# Patient Record
Sex: Male | Born: 1960 | Race: White | Hispanic: No | Marital: Single | Smoking: Current every day smoker
Health system: Southern US, Community
[De-identification: ages and names within clinical notes are randomized; demographics above are authoritative.]

## PROBLEM LIST (undated history)

## (undated) ENCOUNTER — Emergency Department (HOSPITAL_COMMUNITY): Discharge status: Absent without leave | Payer: Medicare Other

## (undated) DIAGNOSIS — B1920 Unspecified viral hepatitis C without hepatic coma: Secondary | ICD-10-CM

## (undated) DIAGNOSIS — F250 Schizoaffective disorder, bipolar type: Secondary | ICD-10-CM

## (undated) DIAGNOSIS — F4310 Post-traumatic stress disorder, unspecified: Secondary | ICD-10-CM

## (undated) DIAGNOSIS — F259 Schizoaffective disorder, unspecified: Secondary | ICD-10-CM

## (undated) DIAGNOSIS — F319 Bipolar disorder, unspecified: Secondary | ICD-10-CM

## (undated) HISTORY — PX: APPENDECTOMY: SHX54

## (undated) HISTORY — PX: GANGLION CYST EXCISION: SHX1691

## (undated) HISTORY — PX: SHOULDER SURGERY: SHX246

## (undated) HISTORY — PX: CERVICAL SPINE SURGERY: SHX589

---

## 2005-11-25 ENCOUNTER — Emergency Department (HOSPITAL_COMMUNITY): Admission: EM | Admit: 2005-11-25 | Discharge: 2005-11-25 | Payer: Self-pay | Admitting: *Deleted

## 2006-07-21 ENCOUNTER — Emergency Department (HOSPITAL_COMMUNITY): Admission: EM | Admit: 2006-07-21 | Discharge: 2006-07-21 | Payer: Self-pay | Admitting: Emergency Medicine

## 2006-09-24 ENCOUNTER — Emergency Department (HOSPITAL_COMMUNITY): Admission: EM | Admit: 2006-09-24 | Discharge: 2006-09-24 | Payer: Self-pay | Admitting: Emergency Medicine

## 2007-04-23 ENCOUNTER — Emergency Department (HOSPITAL_COMMUNITY): Admission: EM | Admit: 2007-04-23 | Discharge: 2007-04-24 | Payer: Self-pay | Admitting: Emergency Medicine

## 2007-04-24 ENCOUNTER — Ambulatory Visit: Payer: Self-pay | Admitting: Psychiatry

## 2007-04-24 ENCOUNTER — Inpatient Hospital Stay (HOSPITAL_COMMUNITY): Admission: AD | Admit: 2007-04-24 | Discharge: 2007-04-28 | Payer: Self-pay | Admitting: Psychiatry

## 2007-05-04 ENCOUNTER — Emergency Department (HOSPITAL_COMMUNITY): Admission: EM | Admit: 2007-05-04 | Discharge: 2007-05-04 | Payer: Self-pay | Admitting: Emergency Medicine

## 2008-04-24 ENCOUNTER — Ambulatory Visit: Payer: Self-pay | Admitting: Psychiatry

## 2008-04-24 ENCOUNTER — Emergency Department (HOSPITAL_COMMUNITY): Admission: EM | Admit: 2008-04-24 | Discharge: 2008-04-25 | Payer: Self-pay | Admitting: Emergency Medicine

## 2008-04-25 ENCOUNTER — Inpatient Hospital Stay (HOSPITAL_COMMUNITY): Admission: EM | Admit: 2008-04-25 | Discharge: 2008-05-01 | Payer: Self-pay | Admitting: Psychiatry

## 2008-05-31 ENCOUNTER — Emergency Department (HOSPITAL_COMMUNITY): Admission: EM | Admit: 2008-05-31 | Discharge: 2008-05-31 | Payer: Self-pay | Admitting: Emergency Medicine

## 2008-07-25 ENCOUNTER — Emergency Department (HOSPITAL_COMMUNITY): Admission: EM | Admit: 2008-07-25 | Discharge: 2008-07-26 | Payer: Self-pay | Admitting: Emergency Medicine

## 2008-10-05 ENCOUNTER — Other Ambulatory Visit: Payer: Self-pay | Admitting: Emergency Medicine

## 2008-10-06 ENCOUNTER — Inpatient Hospital Stay (HOSPITAL_COMMUNITY): Admission: AD | Admit: 2008-10-06 | Discharge: 2008-10-13 | Payer: Self-pay | Admitting: Psychiatry

## 2008-10-06 ENCOUNTER — Ambulatory Visit: Payer: Self-pay | Admitting: Psychiatry

## 2008-10-06 ENCOUNTER — Other Ambulatory Visit: Payer: Self-pay | Admitting: Psychiatry

## 2008-10-17 ENCOUNTER — Other Ambulatory Visit: Payer: Self-pay | Admitting: Emergency Medicine

## 2008-10-18 ENCOUNTER — Inpatient Hospital Stay (HOSPITAL_COMMUNITY): Admission: RE | Admit: 2008-10-18 | Discharge: 2008-10-22 | Payer: Self-pay | Admitting: Psychiatry

## 2008-12-20 ENCOUNTER — Other Ambulatory Visit: Payer: Self-pay | Admitting: Emergency Medicine

## 2008-12-20 ENCOUNTER — Inpatient Hospital Stay (HOSPITAL_COMMUNITY): Admission: EM | Admit: 2008-12-20 | Discharge: 2008-12-25 | Payer: Self-pay | Admitting: Psychiatry

## 2008-12-20 ENCOUNTER — Ambulatory Visit: Payer: Self-pay | Admitting: Psychiatry

## 2009-01-16 ENCOUNTER — Other Ambulatory Visit: Payer: Self-pay | Admitting: Emergency Medicine

## 2009-01-17 ENCOUNTER — Inpatient Hospital Stay (HOSPITAL_COMMUNITY): Admission: AD | Admit: 2009-01-17 | Discharge: 2009-01-23 | Payer: Self-pay | Admitting: *Deleted

## 2009-01-17 ENCOUNTER — Ambulatory Visit: Payer: Self-pay | Admitting: *Deleted

## 2009-03-24 ENCOUNTER — Inpatient Hospital Stay (HOSPITAL_COMMUNITY): Admission: AD | Admit: 2009-03-24 | Discharge: 2009-03-28 | Payer: Self-pay | Admitting: Psychiatry

## 2009-03-24 ENCOUNTER — Ambulatory Visit: Payer: Self-pay | Admitting: Psychiatry

## 2009-03-24 ENCOUNTER — Other Ambulatory Visit: Payer: Self-pay | Admitting: Emergency Medicine

## 2010-03-16 ENCOUNTER — Other Ambulatory Visit: Payer: Self-pay | Admitting: Emergency Medicine

## 2010-03-17 ENCOUNTER — Other Ambulatory Visit: Payer: Self-pay | Admitting: Emergency Medicine

## 2010-03-17 ENCOUNTER — Inpatient Hospital Stay (HOSPITAL_COMMUNITY): Admission: AD | Admit: 2010-03-17 | Discharge: 2010-03-22 | Payer: Self-pay | Admitting: Psychiatry

## 2010-03-17 ENCOUNTER — Ambulatory Visit: Payer: Self-pay | Admitting: Psychiatry

## 2010-05-10 ENCOUNTER — Emergency Department (HOSPITAL_COMMUNITY)
Admission: EM | Admit: 2010-05-10 | Discharge: 2010-05-12 | Payer: Self-pay | Source: Home / Self Care | Admitting: Emergency Medicine

## 2010-05-17 LAB — DIFFERENTIAL
Basophils Absolute: 0 10*3/uL (ref 0.0–0.1)
Basophils Relative: 0 % (ref 0–1)
Eosinophils Absolute: 0 10*3/uL (ref 0.0–0.7)
Eosinophils Relative: 0 % (ref 0–5)
Lymphocytes Relative: 33 % (ref 12–46)
Lymphs Abs: 2.3 10*3/uL (ref 0.7–4.0)
Monocytes Absolute: 0.6 10*3/uL (ref 0.1–1.0)
Monocytes Relative: 8 % (ref 3–12)
Neutro Abs: 4.1 10*3/uL (ref 1.7–7.7)
Neutrophils Relative %: 58 % (ref 43–77)

## 2010-05-17 LAB — CBC
HCT: 41.6 % (ref 39.0–52.0)
Hemoglobin: 14.8 g/dL (ref 13.0–17.0)
MCH: 32.8 pg (ref 26.0–34.0)
MCHC: 35.6 g/dL (ref 30.0–36.0)
MCV: 92.2 fL (ref 78.0–100.0)
Platelets: 268 10*3/uL (ref 150–400)
RBC: 4.51 MIL/uL (ref 4.22–5.81)
RDW: 12.5 % (ref 11.5–15.5)
WBC: 7.1 10*3/uL (ref 4.0–10.5)

## 2010-05-17 LAB — URINALYSIS, ROUTINE W REFLEX MICROSCOPIC
Bilirubin Urine: NEGATIVE
Hgb urine dipstick: NEGATIVE
Ketones, ur: NEGATIVE mg/dL
Nitrite: NEGATIVE
Protein, ur: NEGATIVE mg/dL
Specific Gravity, Urine: 1.017 (ref 1.005–1.030)
Urine Glucose, Fasting: NEGATIVE mg/dL
Urobilinogen, UA: 0.2 mg/dL (ref 0.0–1.0)
pH: 5.5 (ref 5.0–8.0)

## 2010-05-17 LAB — URINE MICROSCOPIC-ADD ON

## 2010-05-17 LAB — BASIC METABOLIC PANEL
BUN: 12 mg/dL (ref 6–23)
CO2: 25 mEq/L (ref 19–32)
Calcium: 9.6 mg/dL (ref 8.4–10.5)
Chloride: 106 mEq/L (ref 96–112)
Creatinine, Ser: 1.06 mg/dL (ref 0.4–1.5)
GFR calc Af Amer: >60 mL/min (ref >60)
GFR calc non Af Amer: >60 mL/min (ref >60)
Glucose, Bld: 95 mg/dL (ref 70–99)
Potassium: 3.8 mEq/L (ref 3.5–5.1)
Sodium: 140 mEq/L (ref 135–145)

## 2010-05-17 LAB — HEPATIC FUNCTION PANEL
ALT: 21 U/L (ref 0–53)
AST: 26 U/L (ref 0–37)
Albumin: 4.4 g/dL (ref 3.5–5.2)
Alkaline Phosphatase: 59 U/L (ref 39–117)
Bilirubin, Direct: 0.1 mg/dL (ref 0.0–0.3)
Indirect Bilirubin: 0.5 mg/dL (ref 0.3–0.9)
Total Bilirubin: 0.6 mg/dL (ref 0.3–1.2)
Total Protein: 7.4 g/dL (ref 6.0–8.3)

## 2010-05-17 LAB — RAPID URINE DRUG SCREEN, HOSP PERFORMED
Amphetamines: NOT DETECTED
Barbiturates: NOT DETECTED
Benzodiazepines: NOT DETECTED
Cocaine: POSITIVE — AB
Opiates: NOT DETECTED
Tetrahydrocannabinol: NOT DETECTED

## 2010-05-17 LAB — ETHANOL: Alcohol, Ethyl (B): 103 mg/dL — ABNORMAL HIGH (ref 0–10)

## 2010-06-03 ENCOUNTER — Emergency Department (HOSPITAL_COMMUNITY)
Admission: EM | Admit: 2010-06-03 | Discharge: 2010-06-04 | Disposition: A | Discharge status: Other Acute Inpatient Hospital | Payer: Medicare Other | Source: Home / Self Care | Attending: Emergency Medicine | Admitting: Emergency Medicine

## 2010-06-03 DIAGNOSIS — F1010 Alcohol abuse, uncomplicated: Secondary | ICD-10-CM | POA: Insufficient documentation

## 2010-06-03 DIAGNOSIS — F329 Major depressive disorder, single episode, unspecified: Secondary | ICD-10-CM | POA: Insufficient documentation

## 2010-06-03 DIAGNOSIS — R45851 Suicidal ideations: Secondary | ICD-10-CM | POA: Insufficient documentation

## 2010-06-03 DIAGNOSIS — F1410 Cocaine abuse, uncomplicated: Secondary | ICD-10-CM | POA: Insufficient documentation

## 2010-06-03 DIAGNOSIS — F3289 Other specified depressive episodes: Secondary | ICD-10-CM | POA: Insufficient documentation

## 2010-06-03 LAB — COMPREHENSIVE METABOLIC PANEL
ALT: 27 U/L (ref 0–53)
AST: 31 U/L (ref 0–37)
Albumin: 4.3 g/dL (ref 3.5–5.2)
Alkaline Phosphatase: 54 U/L (ref 39–117)
BUN: 8 mg/dL (ref 6–23)
CO2: 29 mEq/L (ref 19–32)
Calcium: 10 mg/dL (ref 8.4–10.5)
Chloride: 105 mEq/L (ref 96–112)
Creatinine, Ser: 1.18 mg/dL (ref 0.4–1.5)
GFR calc Af Amer: >60 mL/min (ref >60)
GFR calc non Af Amer: >60 mL/min (ref >60)
Glucose, Bld: 85 mg/dL (ref 70–99)
Potassium: 4.4 mEq/L (ref 3.5–5.1)
Sodium: 143 mEq/L (ref 135–145)
Total Bilirubin: 0.5 mg/dL (ref 0.3–1.2)
Total Protein: 7.2 g/dL (ref 6.0–8.3)

## 2010-06-03 LAB — CBC
HCT: 40.6 % (ref 39.0–52.0)
Hemoglobin: 14.3 g/dL (ref 13.0–17.0)
MCH: 32.4 pg (ref 26.0–34.0)
MCHC: 35.2 g/dL (ref 30.0–36.0)
MCV: 91.9 fL (ref 78.0–100.0)
Platelets: 184 10*3/uL (ref 150–400)
RBC: 4.42 MIL/uL (ref 4.22–5.81)
RDW: 12.7 % (ref 11.5–15.5)
WBC: 9.3 10*3/uL (ref 4.0–10.5)

## 2010-06-03 LAB — ETHANOL: Alcohol, Ethyl (B): 31 mg/dL — ABNORMAL HIGH (ref 0–10)

## 2010-06-03 LAB — RAPID URINE DRUG SCREEN, HOSP PERFORMED
Amphetamines: NOT DETECTED
Barbiturates: NOT DETECTED
Benzodiazepines: NOT DETECTED
Cocaine: POSITIVE — AB
Opiates: NOT DETECTED
Tetrahydrocannabinol: NOT DETECTED

## 2010-06-03 LAB — DIFFERENTIAL
Basophils Absolute: 0 10*3/uL (ref 0.0–0.1)
Basophils Relative: 0 % (ref 0–1)
Eosinophils Absolute: 0 10*3/uL (ref 0.0–0.7)
Eosinophils Relative: 0 % (ref 0–5)
Lymphocytes Relative: 19 % (ref 12–46)
Lymphs Abs: 1.8 10*3/uL (ref 0.7–4.0)
Monocytes Absolute: 0.8 10*3/uL (ref 0.1–1.0)
Monocytes Relative: 8 % (ref 3–12)
Neutro Abs: 6.7 10*3/uL (ref 1.7–7.7)
Neutrophils Relative %: 72 % (ref 43–77)

## 2010-06-04 ENCOUNTER — Inpatient Hospital Stay (HOSPITAL_COMMUNITY)
Admission: RE | Admit: 2010-06-04 | Discharge: 2010-06-11 | DRG: 885 | Disposition: A | Discharge status: Home / Self Care | Payer: Medicare Other | Attending: Psychiatry | Admitting: Psychiatry

## 2010-06-04 DIAGNOSIS — R45851 Suicidal ideations: Secondary | ICD-10-CM

## 2010-06-04 DIAGNOSIS — F1920 Other psychoactive substance dependence, uncomplicated: Secondary | ICD-10-CM

## 2010-06-04 DIAGNOSIS — F319 Bipolar disorder, unspecified: Principal | ICD-10-CM | POA: Diagnosis present

## 2010-06-04 DIAGNOSIS — F1410 Cocaine abuse, uncomplicated: Secondary | ICD-10-CM | POA: Diagnosis present

## 2010-06-04 DIAGNOSIS — F39 Unspecified mood [affective] disorder: Secondary | ICD-10-CM | POA: Diagnosis present

## 2010-06-04 DIAGNOSIS — F1010 Alcohol abuse, uncomplicated: Secondary | ICD-10-CM | POA: Diagnosis present

## 2010-06-04 DIAGNOSIS — M25559 Pain in unspecified hip: Secondary | ICD-10-CM | POA: Diagnosis present

## 2010-06-05 DIAGNOSIS — F1920 Other psychoactive substance dependence, uncomplicated: Secondary | ICD-10-CM

## 2010-06-05 LAB — VALPROIC ACID LEVEL: Valproic Acid Lvl: 7 ug/mL — ABNORMAL LOW (ref 50.0–100.0)

## 2010-06-10 LAB — CBC
HCT: 48.8 % (ref 39.0–52.0)
Hemoglobin: 17 g/dL (ref 13.0–17.0)
MCH: 32.1 pg (ref 26.0–34.0)
MCHC: 34.8 g/dL (ref 30.0–36.0)
MCV: 92.1 fL (ref 78.0–100.0)
Platelets: 158 10*3/uL (ref 150–400)
RBC: 5.3 MIL/uL (ref 4.22–5.81)
RDW: 12.4 % (ref 11.5–15.5)
WBC: 6 10*3/uL (ref 4.0–10.5)

## 2010-06-10 LAB — COMPREHENSIVE METABOLIC PANEL
ALT: 27 U/L (ref 0–53)
AST: 27 U/L (ref 0–37)
Albumin: 4.1 g/dL (ref 3.5–5.2)
Alkaline Phosphatase: 58 U/L (ref 39–117)
BUN: 19 mg/dL (ref 6–23)
CO2: 19 mEq/L (ref 19–32)
Calcium: 9.3 mg/dL (ref 8.4–10.5)
Chloride: 107 mEq/L (ref 96–112)
Creatinine, Ser: 1.28 mg/dL (ref 0.4–1.5)
GFR calc Af Amer: >60 mL/min (ref >60)
GFR calc non Af Amer: 59 mL/min — ABNORMAL LOW (ref >60)
Glucose, Bld: 116 mg/dL — ABNORMAL HIGH (ref 70–99)
Potassium: 5.4 mEq/L — ABNORMAL HIGH (ref 3.5–5.1)
Sodium: 136 mEq/L (ref 135–145)
Total Bilirubin: 0.7 mg/dL (ref 0.3–1.2)
Total Protein: 7.4 g/dL (ref 6.0–8.3)

## 2010-06-10 LAB — VALPROIC ACID LEVEL: Valproic Acid Lvl: 58.4 ug/mL (ref 50.0–100.0)

## 2010-06-15 NOTE — Discharge Summary (Signed)
[name]              ACCOUNT NO.:  [id number]  MEDICAL RECORD NO.:  [id number]           PATIENT TYPE:  I  LOCATION:  0507                          FACILITY:  BH  PHYSICIAN:  Marlis Edelson, DO        DATE OF BIRTH:  1961-05-01  DATE OF ADMISSION:  06/04/2010 DATE OF DISCHARGE:  06/11/2010                              DISCHARGE SUMMARY   CHIEF COMPLAINT:  Trying to stay away from drugs.  HISTORY OF CHIEF COMPLAINT:  [name] is a 50 year old Caucasian male admitted via voluntary status to the [facility] on June 04, 2010.  He had presented because he was trying to stay away from drugs.  He had recently relapsed on cocaine using crack cocaine the day before admission.  That made him feel depressed.  He felt that there was no way out.  He did have suicidal thoughts because of his relapse and states that if he had a gun he would have pulled the trigger. Reports that he has not been taking his medications for the past several days and denied any other specific stressors.  He reported the use of alcohol but was denying any psychotic symptoms or any homicidal ideation.  PAST PSYCHIATRIC HISTORY:  Pertinent for previous stay at the [facility] in November of 2011.  He has had similar admissions because of a history of bipolar disorder, alcohol use and cocaine use. He has not recently seen his psychiatrist.  He stated that he comes to Behavioral Health to get his medications.  MEDICATIONS: 1. Reported medications which he was currently not taking included     Depakote 1500 mg q.h.s. 2. Seroquel 700 mg at bedtime. 3. Trazodone 100 mg at bedtime.  ALLERGIES:  No known drug allergies.  He relates when he is on his medications his mood is more stable and he is less likely to abuse drugs.  HOSPITAL COURSE:  Mr. Macmillan was admitted to the adult substance abuse treatment unit.  He was placed in the adult milieu with substance abuse groups  and AA.  Early in his admission he had difficulty sleeping.  This improved with dose adjustments of his Seroquel during the course of his hospitalization.  He had no significant withdrawal symptoms including no DTs, no seizures and no other physical complications with the exception of diarrhea later in his admission.  Medications included the reinitiation of Seroquel 700 mg at bedtime, trazodone 200 mg at bedtime and Depakote 1500 mg at bedtime.  By June 07, 2010, he stated he felt a little tired.  He was having some problems with early morning awakening.  No suicidal or homicidal thoughts.  His suicidal thoughts had completely resolved.  He was having no aches and pains, no nausea, vomiting, no diarrhea at that time and no cravings.  His perceptual symptoms had completely resolved with the restart of Seroquel.  He continued to feel better by February 7 stating he slept better.  His appetite was good.  He again had no SI or HI.  He looked better.  His head was up.  He was smiling and engaging.  We continued his current medications.  On June 09, 2010, he started to develop diarrhea following an episode in which he ate sausage.  He had no fever, no chills, no vomiting, no abdominal tenderness, no rebound symptoms. There was no blood in his stools.  He was treated symptomatically by encouraging fluids, increasing his p.o. intake with Gatorade and adding Imodium.  His mood, however, was good.  He was looking forward to the [facility] game on the evening of June 09, 2010.  He had no other complaints.  On February 9 his diarrhea had increased.  His vital signs remained stable.  We held the Depakote for 1 night and checked a valproic acid level.  Laboratory returned showing a normal sodium, potassium, chloride, CO2, BUN and creatinine and glucose of 116.  AST was 27, ALT was 27, total protein 7.4, albumin 4.1, calcium 9.3, alk phos 58.  CBC was 6.0, hemoglobin 17.0, hematocrit 48.8 and  platelet count was 158.  Indices were normal.  His valproic acid level returned and was within normal limits.  He again tolerated fluids well.  He had no further suicidal or homicidal thoughts.  He felt medications were working.  Insight into his drug use had increased.  He stated his mood was level, his abdominal pain completely resolved and he was ready for discharge on June 11, 2010.  LABORATORY AND IMAGING:  As above.  CONSULTATIONS:  None.  COMPLICATIONS:  Diarrhea as noted above.  PROCEDURES:  None.  MENTAL STATUS EXAM:  At time of discharge he was casually dressed, established good eye contact.  Motor behavior was normal.  Speech was clear.  Level of consciousness was alert.  His mood was "fine", affect was appropriate, anxiety level none.  Thought process linear, logical and goal-directed.  Thought content without perceptual symptoms, ideas of reference, delusions or paranoia.  His judgment was intact.  His insight was increased.  He was oriented and displayed good concentration.  DISCHARGE CONDITION:  Was improved with an uneventful detox.  Resolution of suicidal ideation.  No homicidal ideation.  No hypomania, mania or psychosis.  No intolerance to medications or medication reactions.  DISCHARGE INSTRUCTIONS:  He is to follow up with the [facility] on Wednesday June 16, 2010, at 4:00 p.m.  I recommended involvement in Alcoholics Anonymous with 90 meetings in 90 days, recommended followup with his primary care physician on the Monday following discharge with the [facility] [place]  FURTHER INSTRUCTIONS:  He is return to the hospital for any marked change in mood or affect, development of suicidal or homicidal ideation or adverse reactions to medications.  DISCHARGE MEDICATIONS: 1. Quetiapine 700 mg q.h.s. 2. Trazodone 200 mg q.h.s. 3. Depakote 1500 mg ER p.o. q.h.s.  PROGNOSIS:  Fair with appropriate psychiatric  followup, medication compliance, involvement in an abstinence based treatment program which is ongoing.          ______________________________ Marlis Edelson, DO    DB/MEDQ  D:  06/14/2010  T:  06/14/2010  Job:  [id number]  Electronically Signed by Marlis Edelson MD on 06/15/2010 08:22:32 PM

## 2010-06-15 NOTE — H&P (Signed)
[name], [name]              ACCOUNT NO.:  [id number]  MEDICAL RECORD NO.:  [id number]           PATIENT TYPE:  I  LOCATION:  0507                          FACILITY:  BH  PHYSICIAN:  Brent Edelson, DO        DATE OF BIRTH:  [birth year]  DATE OF ADMISSION:  06/04/2010 DATE OF DISCHARGE:                      PSYCHIATRIC ADMISSION ASSESSMENT   This is on a 50 year old male, voluntarily admitted on June 04, 2010.  HISTORY OF PRESENT ILLNESS:  The patient states that he is here because he is trying to stay away from drugs.  He relapsed on crack cocaine yesterday, feeling depressed.  Felt there was "no way out of it."  He felt suicidal because of this and states that, if he had a gun, he would have pulled the trigger.  He reports that he has not been taking his medications for the past 2 days and denies any other specific stressors. He also reports use of alcohol.  Denies any psychosis and denies any homicidal ideation.  PAST PSYCHIATRIC HISTORY:  The patient was here in November 2011, has had other similar admissions.  Admitted in November for history of bipolar disorder and a history of cocaine and alcohol use.  States he has not seen his psychiatrist and comes to Behavioral Health to get his medications.  SOCIAL HISTORY:  The patient lives in [place].  He lives alone.  He is on disability, he states for a history of schizophrenia.  Denies any legal charges.  FAMILY HISTORY:  States his grandfather was in a sanitarium at an early age.  He has no details.  ALCOHOL OR DRUG HISTORY:  As above.  Denies any IV drug use.  Denies any blackouts or seizures.  PRIMARY CARE PROVIDER:  Dr. Maisie [name] in [place] at the [place].  MEDICAL PROBLEMS:  Reports a history of pain in his hips.  Denies any other acute or chronic health issues.  MEDICATIONS:  States he has been off his medications for 2 days. 1. Has been taking Depakote 1500 mg at bedtime. 2. Seroquel 700 mg at bedtime. 3.  Trazodone 100 mg at bedtime.  DRUG ALLERGIES:  No known allergies.  PHYSICAL EXAM:  This is a middle-aged male, assessed in the emergency department.  Physical exam was reviewed with no significant findings.  LABORATORY DATA:  Shows an alcohol level of 31, CMET within normal limits.  Urine drug screen is positive for cocaine.  CBC within normal limits.  MENTAL STATUS EXAM:  The patient is in bed.  No eye contact.  Answers questions briefly.  Does not offer any other details.  Speech is clear, normal pace and tone.  Mood is depressed.  Thought processes are coherent, goal directed.  No evidence of any psychotic symptoms. Cognitive function intact.  The patient seems aware of self, situation and place.  AXIS I:  Mood disorder. Polysubstance abuse, rule out dependence. AXIS II:  Deferred. AXIS III:  History of hip pain. AXIS IV:  Deferred at this time. AXIS V:  Current is 35-40.  PLAN:  We will continue the patient's medications.  We will have Seroquel 200 mg at bedtime, have  Seroquel on a b.i.d. basis foragitation.  Resume his Depakote at 500 mg and have trazodone for sleep. Assess his substance use, relapsing, assess his motivation for rehab. His tentative length of stay is 3-5 days.     [name], N.P.   ______________________________ Brent Edelson, DO    JO/MEDQ  D:  06/04/2010  T:  06/04/2010  Job:  [id number]  Electronically Signed by Limmie PatriciaP. on 06/14/2010 03:17:31 PM Electronically Signed by Brent Edelson MD on 06/15/2010 08:22:27 PM

## 2010-07-13 LAB — CBC
HCT: 41.7 % (ref 39.0–52.0)
HCT: 43.4 % (ref 39.0–52.0)
Hemoglobin: 14.8 g/dL (ref 13.0–17.0)
Hemoglobin: 15 g/dL (ref 13.0–17.0)
MCH: 32.5 pg (ref 26.0–34.0)
MCH: 33 pg (ref 26.0–34.0)
MCHC: 34.5 g/dL (ref 30.0–36.0)
MCHC: 35.5 g/dL (ref 30.0–36.0)
MCV: 93.2 fL (ref 78.0–100.0)
MCV: 94.2 fL (ref 78.0–100.0)
Platelets: 152 10*3/uL (ref 150–400)
Platelets: 178 10*3/uL (ref 150–400)
RBC: 4.48 MIL/uL (ref 4.22–5.81)
RBC: 4.61 MIL/uL (ref 4.22–5.81)
RDW: 12.9 % (ref 11.5–15.5)
RDW: 13 % (ref 11.5–15.5)
WBC: 4 10*3/uL (ref 4.0–10.5)
WBC: 6.1 10*3/uL (ref 4.0–10.5)

## 2010-07-13 LAB — BASIC METABOLIC PANEL
BUN: 13 mg/dL (ref 6–23)
CO2: 26 mEq/L (ref 19–32)
Calcium: 9.3 mg/dL (ref 8.4–10.5)
Chloride: 111 mEq/L (ref 96–112)
Creatinine, Ser: 1.25 mg/dL (ref 0.4–1.5)
GFR calc Af Amer: >60 mL/min (ref >60)
GFR calc non Af Amer: >60 mL/min (ref >60)
Glucose, Bld: 100 mg/dL — ABNORMAL HIGH (ref 70–99)
Potassium: 4 mEq/L (ref 3.5–5.1)
Sodium: 145 mEq/L (ref 135–145)

## 2010-07-13 LAB — HEPATIC FUNCTION PANEL
ALT: 16 U/L (ref 0–53)
ALT: 22 U/L (ref 0–53)
AST: 16 U/L (ref 0–37)
AST: 20 U/L (ref 0–37)
Albumin: 3.4 g/dL — ABNORMAL LOW (ref 3.5–5.2)
Albumin: 3.5 g/dL (ref 3.5–5.2)
Alkaline Phosphatase: 50 U/L (ref 39–117)
Bilirubin, Direct: 0.1 mg/dL (ref 0.0–0.3)
Indirect Bilirubin: 0.1 mg/dL — ABNORMAL LOW (ref 0.3–0.9)
Total Bilirubin: 0.2 mg/dL — ABNORMAL LOW (ref 0.3–1.2)
Total Protein: 5.9 g/dL — ABNORMAL LOW (ref 6.0–8.3)
Total Protein: 6.1 g/dL (ref 6.0–8.3)

## 2010-07-13 LAB — TRICYCLICS SCREEN, URINE: TCA Scrn: NOT DETECTED

## 2010-07-13 LAB — DIFFERENTIAL
Basophils Absolute: 0 10*3/uL (ref 0.0–0.1)
Basophils Relative: 1 % (ref 0–1)
Eosinophils Absolute: 0.1 10*3/uL (ref 0.0–0.7)
Eosinophils Relative: 1 % (ref 0–5)
Lymphocytes Relative: 42 % (ref 12–46)
Lymphs Abs: 2.6 10*3/uL (ref 0.7–4.0)
Monocytes Absolute: 0.4 10*3/uL (ref 0.1–1.0)
Monocytes Relative: 7 % (ref 3–12)
Neutro Abs: 3 10*3/uL (ref 1.7–7.7)
Neutrophils Relative %: 49 % (ref 43–77)

## 2010-07-13 LAB — RAPID URINE DRUG SCREEN, HOSP PERFORMED
Amphetamines: NOT DETECTED
Barbiturates: NOT DETECTED
Benzodiazepines: NOT DETECTED
Cocaine: NOT DETECTED
Opiates: NOT DETECTED
Tetrahydrocannabinol: NOT DETECTED

## 2010-07-13 LAB — VALPROIC ACID LEVEL
Valproic Acid Lvl: 40.7 ug/mL — ABNORMAL LOW (ref 50.0–100.0)
Valproic Acid Lvl: <10 ug/mL — ABNORMAL LOW (ref 50.0–100.0)

## 2010-07-13 LAB — ETHANOL: Alcohol, Ethyl (B): 178 mg/dL — ABNORMAL HIGH (ref 0–10)

## 2010-08-04 LAB — CBC
MCHC: 34.5 g/dL (ref 30.0–36.0)
Platelets: 178 10*3/uL (ref 150–400)
RDW: 12.9 % (ref 11.5–15.5)

## 2010-08-04 LAB — ETHANOL: Alcohol, Ethyl (B): 11 mg/dL — ABNORMAL HIGH (ref 0–10)

## 2010-08-04 LAB — HEPATIC FUNCTION PANEL
Albumin: 3.3 g/dL — ABNORMAL LOW (ref 3.5–5.2)
Alkaline Phosphatase: 58 U/L (ref 39–117)
Indirect Bilirubin: 0.4 mg/dL (ref 0.3–0.9)
Total Protein: 5.8 g/dL — ABNORMAL LOW (ref 6.0–8.3)

## 2010-08-04 LAB — POCT I-STAT, CHEM 8
BUN: 14 mg/dL (ref 6–23)
Calcium, Ion: 1.13 mmol/L (ref 1.12–1.32)
HCT: 42 % (ref 39.0–52.0)
Sodium: 138 mEq/L (ref 135–145)
TCO2: 22 mmol/L (ref 0–100)

## 2010-08-04 LAB — RAPID URINE DRUG SCREEN, HOSP PERFORMED
Benzodiazepines: NOT DETECTED
Cocaine: POSITIVE — AB
Opiates: NOT DETECTED
Tetrahydrocannabinol: NOT DETECTED

## 2010-08-04 LAB — VALPROIC ACID LEVEL: Valproic Acid Lvl: 34.4 ug/mL — ABNORMAL LOW (ref 50.0–100.0)

## 2010-08-04 LAB — RPR: RPR Ser Ql: NONREACTIVE

## 2010-08-04 LAB — DIFFERENTIAL
Basophils Absolute: 0 10*3/uL (ref 0.0–0.1)
Basophils Relative: 0 % (ref 0–1)
Eosinophils Relative: 0 % (ref 0–5)
Lymphocytes Relative: 21 % (ref 12–46)
Neutro Abs: 6.9 10*3/uL (ref 1.7–7.7)

## 2010-08-06 LAB — COMPREHENSIVE METABOLIC PANEL
AST: 21 U/L (ref 0–37)
Albumin: 4.1 g/dL (ref 3.5–5.2)
Alkaline Phosphatase: 56 U/L (ref 39–117)
BUN: 19 mg/dL (ref 6–23)
Chloride: 109 mEq/L (ref 96–112)
GFR calc Af Amer: >60 mL/min (ref >60)
Potassium: 4.5 mEq/L (ref 3.5–5.1)
Sodium: 140 mEq/L (ref 135–145)
Total Bilirubin: 0.5 mg/dL (ref 0.3–1.2)
Total Protein: 7.1 g/dL (ref 6.0–8.3)

## 2010-08-06 LAB — URINALYSIS, ROUTINE W REFLEX MICROSCOPIC
Hgb urine dipstick: NEGATIVE
Nitrite: NEGATIVE
Protein, ur: NEGATIVE mg/dL
Specific Gravity, Urine: 1.029 (ref 1.005–1.030)
Urobilinogen, UA: 0.2 mg/dL (ref 0.0–1.0)

## 2010-08-06 LAB — DIFFERENTIAL
Basophils Relative: 0 % (ref 0–1)
Eosinophils Relative: 1 % (ref 0–5)
Monocytes Absolute: 0.5 10*3/uL (ref 0.1–1.0)
Monocytes Relative: 12 % (ref 3–12)
Neutro Abs: 2.3 10*3/uL (ref 1.7–7.7)

## 2010-08-06 LAB — CBC
HCT: 44.8 % (ref 39.0–52.0)
Platelets: 206 10*3/uL (ref 150–400)
RDW: 12.5 % (ref 11.5–15.5)
WBC: 4.6 10*3/uL (ref 4.0–10.5)

## 2010-08-06 LAB — BASIC METABOLIC PANEL
Calcium: 9.4 mg/dL (ref 8.4–10.5)
Chloride: 100 mEq/L (ref 96–112)
Creatinine, Ser: 1.07 mg/dL (ref 0.4–1.5)
GFR calc Af Amer: >60 mL/min (ref >60)
Sodium: 136 mEq/L (ref 135–145)

## 2010-08-06 LAB — RAPID URINE DRUG SCREEN, HOSP PERFORMED
Benzodiazepines: NOT DETECTED
Cocaine: POSITIVE — AB
Tetrahydrocannabinol: NOT DETECTED

## 2010-08-06 LAB — ETHANOL: Alcohol, Ethyl (B): <5 mg/dL (ref 0–10)

## 2010-08-07 LAB — DIFFERENTIAL
Eosinophils Absolute: 0 10*3/uL (ref 0.0–0.7)
Eosinophils Relative: 0 % (ref 0–5)
Lymphs Abs: 1.8 10*3/uL (ref 0.7–4.0)
Monocytes Absolute: 0.7 10*3/uL (ref 0.1–1.0)
Monocytes Relative: 7 % (ref 3–12)
Neutrophils Relative %: 74 % (ref 43–77)

## 2010-08-07 LAB — RAPID URINE DRUG SCREEN, HOSP PERFORMED: Barbiturates: NOT DETECTED

## 2010-08-07 LAB — URINALYSIS, ROUTINE W REFLEX MICROSCOPIC
Nitrite: NEGATIVE
Protein, ur: NEGATIVE mg/dL
Specific Gravity, Urine: 1.003 — ABNORMAL LOW (ref 1.005–1.030)
Urobilinogen, UA: 0.2 mg/dL (ref 0.0–1.0)

## 2010-08-07 LAB — POCT I-STAT, CHEM 8
BUN: 9 mg/dL (ref 6–23)
Creatinine, Ser: 1.2 mg/dL (ref 0.4–1.5)
Glucose, Bld: 107 mg/dL — ABNORMAL HIGH (ref 70–99)
Sodium: 136 mEq/L (ref 135–145)
TCO2: 25 mmol/L (ref 0–100)

## 2010-08-07 LAB — CBC
HCT: 41.7 % (ref 39.0–52.0)
Hemoglobin: 14.5 g/dL (ref 13.0–17.0)
MCV: 93.1 fL (ref 78.0–100.0)
RBC: 4.48 MIL/uL (ref 4.22–5.81)
WBC: 9.8 10*3/uL (ref 4.0–10.5)

## 2010-08-09 LAB — RAPID URINE DRUG SCREEN, HOSP PERFORMED
Amphetamines: NOT DETECTED
Barbiturates: NOT DETECTED
Barbiturates: NOT DETECTED
Benzodiazepines: NOT DETECTED
Cocaine: POSITIVE — AB
Opiates: NOT DETECTED
Opiates: NOT DETECTED
Tetrahydrocannabinol: POSITIVE — AB

## 2010-08-09 LAB — DIFFERENTIAL
Basophils Absolute: 0 10*3/uL (ref 0.0–0.1)
Basophils Absolute: 0 10*3/uL (ref 0.0–0.1)
Basophils Relative: 1 % (ref 0–1)
Basophils Relative: 0 % (ref 0–1)
Eosinophils Absolute: 0.1 10*3/uL (ref 0.0–0.7)
Eosinophils Relative: 1 % (ref 0–5)
Lymphocytes Relative: 30 % (ref 12–46)
Monocytes Absolute: 0.5 10*3/uL (ref 0.1–1.0)
Neutro Abs: 6 10*3/uL (ref 1.7–7.7)
Neutrophils Relative %: 76 % (ref 43–77)

## 2010-08-09 LAB — POCT I-STAT, CHEM 8
HCT: 37 % — ABNORMAL LOW (ref 39.0–52.0)
Hemoglobin: 12.6 g/dL — ABNORMAL LOW (ref 13.0–17.0)
Potassium: 4.2 mEq/L (ref 3.5–5.1)
Sodium: 136 mEq/L (ref 135–145)

## 2010-08-09 LAB — CBC
HCT: 46.1 % (ref 39.0–52.0)
MCHC: 34 g/dL (ref 30.0–36.0)
MCHC: 34.1 g/dL (ref 30.0–36.0)
MCV: 94.5 fL (ref 78.0–100.0)
Platelets: 208 10*3/uL (ref 150–400)
RBC: 3.94 MIL/uL — ABNORMAL LOW (ref 4.22–5.81)
RDW: 12.5 % (ref 11.5–15.5)
RDW: 12.1 % (ref 11.5–15.5)

## 2010-08-09 LAB — HEPATIC FUNCTION PANEL
Bilirubin, Direct: 0.1 mg/dL (ref 0.0–0.3)
Indirect Bilirubin: 0.5 mg/dL (ref 0.3–0.9)
Total Protein: 6.6 g/dL (ref 6.0–8.3)

## 2010-08-09 LAB — URINALYSIS, ROUTINE W REFLEX MICROSCOPIC
Bilirubin Urine: NEGATIVE
Glucose, UA: NEGATIVE mg/dL
Hgb urine dipstick: NEGATIVE
Hgb urine dipstick: NEGATIVE
Protein, ur: NEGATIVE mg/dL
Protein, ur: NEGATIVE mg/dL
Specific Gravity, Urine: 1.02 (ref 1.005–1.030)
Urobilinogen, UA: 0.2 mg/dL (ref 0.0–1.0)

## 2010-08-09 LAB — POCT CARDIAC MARKERS
CKMB, poc: 2 ng/mL (ref 1.0–8.0)
Myoglobin, poc: 187 ng/mL (ref 12–200)

## 2010-08-09 LAB — URINE MICROSCOPIC-ADD ON

## 2010-08-09 LAB — BASIC METABOLIC PANEL
BUN: 16 mg/dL (ref 6–23)
CO2: 27 mEq/L (ref 19–32)
GFR calc non Af Amer: >60 mL/min (ref >60)
Glucose, Bld: 110 mg/dL — ABNORMAL HIGH (ref 70–99)
Potassium: 4.3 mEq/L (ref 3.5–5.1)

## 2010-08-12 LAB — RAPID URINE DRUG SCREEN, HOSP PERFORMED
Barbiturates: NOT DETECTED
Benzodiazepines: NOT DETECTED
Cocaine: POSITIVE — AB
Opiates: NOT DETECTED

## 2010-08-12 LAB — DIFFERENTIAL
Basophils Absolute: 0.1 10*3/uL (ref 0.0–0.1)
Eosinophils Absolute: 0.1 10*3/uL (ref 0.0–0.7)
Eosinophils Relative: 2 % (ref 0–5)
Lymphocytes Relative: 38 % (ref 12–46)
Lymphs Abs: 2.2 10*3/uL (ref 0.7–4.0)
Monocytes Absolute: 0.4 10*3/uL (ref 0.1–1.0)

## 2010-08-12 LAB — POCT I-STAT, CHEM 8
HCT: 50 % (ref 39.0–52.0)
Hemoglobin: 17 g/dL (ref 13.0–17.0)
Potassium: 4 mEq/L (ref 3.5–5.1)
Sodium: 144 mEq/L (ref 135–145)

## 2010-08-12 LAB — CBC
HCT: 46.8 % (ref 39.0–52.0)
Hemoglobin: 16.1 g/dL (ref 13.0–17.0)
MCV: 95 fL (ref 78.0–100.0)
Platelets: 192 10*3/uL (ref 150–400)
RDW: 13 % (ref 11.5–15.5)

## 2010-09-07 ENCOUNTER — Emergency Department (HOSPITAL_COMMUNITY)
Admission: EM | Admit: 2010-09-07 | Discharge: 2010-09-08 | Disposition: A | Discharge status: Other Acute Inpatient Hospital | Payer: Medicare Other | Source: Home / Self Care | Attending: Emergency Medicine | Admitting: Emergency Medicine

## 2010-09-07 DIAGNOSIS — F489 Nonpsychotic mental disorder, unspecified: Secondary | ICD-10-CM | POA: Insufficient documentation

## 2010-09-07 DIAGNOSIS — R45850 Homicidal ideations: Secondary | ICD-10-CM | POA: Insufficient documentation

## 2010-09-07 DIAGNOSIS — R45851 Suicidal ideations: Secondary | ICD-10-CM | POA: Insufficient documentation

## 2010-09-07 DIAGNOSIS — F1010 Alcohol abuse, uncomplicated: Secondary | ICD-10-CM | POA: Insufficient documentation

## 2010-09-07 DIAGNOSIS — F1910 Other psychoactive substance abuse, uncomplicated: Secondary | ICD-10-CM | POA: Insufficient documentation

## 2010-09-07 DIAGNOSIS — IMO0002 Reserved for concepts with insufficient information to code with codable children: Secondary | ICD-10-CM | POA: Insufficient documentation

## 2010-09-07 LAB — CBC
HCT: 38.2 % — ABNORMAL LOW (ref 39.0–52.0)
Hemoglobin: 13.6 g/dL (ref 13.0–17.0)
MCV: 91 fL (ref 78.0–100.0)
RBC: 4.2 MIL/uL — ABNORMAL LOW (ref 4.22–5.81)
RDW: 12.3 % (ref 11.5–15.5)
WBC: 5.9 10*3/uL (ref 4.0–10.5)

## 2010-09-07 LAB — URINALYSIS, ROUTINE W REFLEX MICROSCOPIC
Bilirubin Urine: NEGATIVE
Nitrite: NEGATIVE
Protein, ur: NEGATIVE mg/dL
Specific Gravity, Urine: 1.01 (ref 1.005–1.030)
Urobilinogen, UA: 0.2 mg/dL (ref 0.0–1.0)

## 2010-09-07 LAB — RAPID URINE DRUG SCREEN, HOSP PERFORMED
Opiates: NOT DETECTED
Tetrahydrocannabinol: NOT DETECTED

## 2010-09-07 LAB — COMPREHENSIVE METABOLIC PANEL
ALT: 15 U/L (ref 0–53)
Alkaline Phosphatase: 63 U/L (ref 39–117)
BUN: 10 mg/dL (ref 6–23)
CO2: 28 mEq/L (ref 19–32)
Chloride: 103 mEq/L (ref 96–112)
GFR calc non Af Amer: >60 mL/min (ref >60)
Glucose, Bld: 109 mg/dL — ABNORMAL HIGH (ref 70–99)
Potassium: 3.2 mEq/L — ABNORMAL LOW (ref 3.5–5.1)
Total Bilirubin: 0.3 mg/dL (ref 0.3–1.2)

## 2010-09-07 LAB — DIFFERENTIAL
Eosinophils Relative: 1 % (ref 0–5)
Lymphocytes Relative: 31 % (ref 12–46)
Lymphs Abs: 1.8 10*3/uL (ref 0.7–4.0)
Neutro Abs: 3.7 10*3/uL (ref 1.7–7.7)

## 2010-09-07 LAB — ETHANOL: Alcohol, Ethyl (B): 150 mg/dL — ABNORMAL HIGH (ref 0–10)

## 2010-09-08 ENCOUNTER — Inpatient Hospital Stay (HOSPITAL_COMMUNITY)
Admission: AD | Admit: 2010-09-08 | Discharge: 2010-09-14 | DRG: 897 | Disposition: A | Discharge status: Home / Self Care | Payer: Medicare Other | Source: Ambulatory Visit | Attending: Psychiatry | Admitting: Psychiatry

## 2010-09-08 DIAGNOSIS — F1020 Alcohol dependence, uncomplicated: Principal | ICD-10-CM | POA: Diagnosis present

## 2010-09-08 DIAGNOSIS — F259 Schizoaffective disorder, unspecified: Secondary | ICD-10-CM | POA: Diagnosis present

## 2010-09-08 DIAGNOSIS — F4322 Adjustment disorder with anxiety: Secondary | ICD-10-CM

## 2010-09-08 DIAGNOSIS — F1410 Cocaine abuse, uncomplicated: Secondary | ICD-10-CM | POA: Diagnosis present

## 2010-09-08 DIAGNOSIS — I10 Essential (primary) hypertension: Secondary | ICD-10-CM | POA: Diagnosis present

## 2010-09-08 DIAGNOSIS — E785 Hyperlipidemia, unspecified: Secondary | ICD-10-CM | POA: Diagnosis present

## 2010-09-09 DIAGNOSIS — F1920 Other psychoactive substance dependence, uncomplicated: Secondary | ICD-10-CM

## 2010-09-11 LAB — GLUCOSE, CAPILLARY: Glucose-Capillary: 121 mg/dL — ABNORMAL HIGH (ref 70–99)

## 2010-09-12 LAB — DRUGS OF ABUSE SCREEN W/O ALC, ROUTINE URINE
Barbiturate Quant, Ur: NEGATIVE
Methadone: NEGATIVE
Opiate Screen, Urine: NEGATIVE
Phencyclidine (PCP): NEGATIVE
Propoxyphene: NEGATIVE

## 2010-09-14 NOTE — H&P (Signed)
NAMEMOHAN, ERVEN              ACCOUNT NO.:  [id number]   MEDICAL RECORD NO.:  [id number]          PATIENT TYPE:  IPS   LOCATION:  0507                          FACILITY:  BH   PHYSICIAN:  Geoffery Lyons, M.D.      DATE OF BIRTH:  11-30-1960   DATE OF ADMISSION:  04/25/2008  DATE OF DISCHARGE:                       PSYCHIATRIC ADMISSION ASSESSMENT   PATIENT IDENTIFICATION:  A 50 year old male involuntarily petitioned  April 24, 2008.   HISTORY OF PRESENT ILLNESS:  The patient again is here on petition.  Papers states the patient was hostile and aggressive.  Considered a  danger to himself and others to alcohol and being off his medications.  He has been drinking and smoking cocaine.  Again, has been off his  medications for some period of time, threatened to kill two of his  friends.  Also endorsing auditory hallucinations.   PAST PSYCHIATRIC HISTORY:  First the patient was here about one year ago  again.  He was admitted last December 23 with an overdose on Benadryl,  drinking and using cocaine.   SOCIAL HISTORY:  This is a 50 year old male.  He is unemployed, living  alone and lives here in [place].  Per chart, it is noted that the  patient may have a history of an assault or possible murder charge in  the past.   FAMILY HISTORY:  None known.   ALCOHOL AND DRUG HABITS:  As above, using cocaine and drinking alcohol.   PRIMARY CARE Emmanuela Ghazi:  [facility] [place] in [place].   MEDICAL PROBLEMS:  None known.   MEDICATIONS:  The patient has been on Depakote and Seroquel but is not  compliant.   DRUG ALLERGIES:  No known allergies.   PHYSICAL EXAMINATION:  GENERAL:  This is a middle-aged male who was  fully assessed at [facility] emergency department.  His physical exam  was reviewed with no significant findings.  He did receive Geodon IM.  It was noted that he was argumentative and combative.  VITAL SIGNS:  Temperature 97.5, 78 heart rate, 18 respirations, blood pressure is  114/66.  He is 114/66.   LABORATORY DATA:  Shows an alcohol level 269.  Urine drug screen is  positive for cocaine and his CBC is within normal limits.  His liver  function is within normal limits.   DIAGNOSES:  The patient at this time is in the bed.  His eyes are  closed.  He is somewhat sleepy.  Speech is soft spoken.  His mood is  depressed.  The patient is calm at this time, not hostile or aggressive.  Thought processes endorsing auditory hallucinations.  He has promised  safety on the unit.  His memory is fair.  His judgment is poor.  Insight  is limited.   AXIS I:  Schizoaffective disorder, polysubstance abuse.  AXIS II:  Deferred.  AXIS III:  None that we are aware of.  AXIS IV:  Possible problems with his primary support group which seems  to be his friends at this time.  Other psychosocial problems, chronic  substance use.  AXIS V:  Current is 30.  PLAN:  Detox patient with Librium protocol.  We will resume his Depakote  and Seroquel.  Will reinforce medication compliance.  The patient will  be in a dual diagnosis group.  Will continue to identify support group  and assess his comorbidities and case manager will assess his living  situation.  His tentative length of stay at this time is 4-6 days.      Landry Corporal, N.P.      Geoffery Lyons, M.D.  Electronically Signed    JO/MEDQ  D:  118-Feb-202009  T:  118-Feb-202009  Job:  [id number]

## 2010-09-14 NOTE — H&P (Signed)
[name], [name]              ACCOUNT NO.:  [id number]   MEDICAL RECORD NO.:  [id number]          PATIENT TYPE:  IPS   LOCATION:  0501                          FACILITY:  BH   PHYSICIAN:  Geoffery Lyons, M.D.      DATE OF BIRTH:  1960-06-17   DATE OF ADMISSION:  10/18/2008  DATE OF DISCHARGE:                       PSYCHIATRIC ADMISSION ASSESSMENT   This is an involuntary admission to the services of Dr.  Geoffery Lyons.   This is a 50 year old single white male who has just been discharged  from the [facility].  He was here with [place] from June 7 to  June 14.  He was brought to the ED  yesterday with complaints of chest  pain.  He was found to have cocaine in his urine.  He did report  relapsing on crack.  He was a direct admit from the ED per the ED  doctor, Dr. Rito Ehrlich.  He apparently told EMS he may as well kill  himself, as he cannot go on living this way.  He is reportedly to have a  history for schizo-affective disease.  Like I said, he was just  discharged.   PAST PSYCHIATRIC HISTORY:  He was just with [place] from June 7 to June 14,  and approximately a year ago also.  He states that he has been here for  the past 2 Christmases.  He is a client of [facility].   SOCIAL HISTORY:  He lives alone in [place].  He is disabled due to  psychiatric illness.   FAMILY HISTORY:  He denies.   ALCOHOL/DRUG HISTORY:  He denies any seizure activity or withdrawal  DT's.  He does abuse alcohol and cocaine, although today it is just  cocaine.  He began using alcohol at age 44 and crack at age 73.   PRIMARY CARE Shlomie Romig:  He is listing the [facility] in [place].   MEDICAL PROBLEMS:  He is not known to have any.  Hypercholesterolemia.  He has been taking simvastatin for that.   PHYSICAL EXAMINATION:  He was medically cleared in the ED at [facility].  As already stated, his UDS was positive for cocaine.  His  urinalysis was negative.  He had no urinary  tract infection.  His other  labs had nothing untoward in their findings.  He is slightly anemic.  Alcohol level was less than 5.  VITAL SIGNS:  His temperature was 98.6, pulse 86 to 96, respirations 16,  blood pressure 122/86 to 131/86.   MENTAL STATUS EXAM:  He is alert and cooperative.  He has good eye  contact.  He is appropriately groomed, dressed, and nourished in his own  clothing.  His speech is clear with normal pace and tone.  His mood and  affect are somewhat depressed.  He is asking for help.  Agreeable to  recommendations.  Today, he is not reporting any hallucinations at this  time, although last week he was.  His judgment and insight are intact.  Concentration and memory are intact.  Intelligence is at least average.   DIAGNOSES:  AXIS I:  1. Schizo-affective disorder.  2. Cocaine abuse, rule out dependence.   AXIS II:  Deferred.   AXIS III:  Hypercholesterolemia.   AXIS IV:  Severe financial issues.   AXIS V:  35.   PLAN:  Restart his discharge medications.  This would be Depakote 500 mg  q.h.s., Seroquel 50 mg in the a.m., Seroquel 50 at noon, Seroquel 600 mg  q.h.s., and simvastatin 20 mg p.o. daily.  We can have our case manger  to call to the [facility] to check on benefits and to check for  getting him into the long-term substance abuse program, [facility], there.  The case manager can call the [place] at [phone], extension [phone], and  ask for the [facility] program and use that for discharge planning if he is  wishing to utilize his [facility] connection.      Mickie Leonarda Salon, P.A.-C.      Geoffery Lyons, M.D.  Electronically Signed    MD/MEDQ  D:  10/18/2008  T:  10/18/2008  Job:  [id number]

## 2010-09-14 NOTE — H&P (Signed)
[name], [name]              ACCOUNT NO.:  [id number]   MEDICAL RECORD NO.:  [id number]          PATIENT TYPE:  IPS   LOCATION:  0407                          FACILITY:  BH   PHYSICIAN:  Anselm Jungling, MD  DATE OF BIRTH:  09/30/1960   DATE OF ADMISSION:  10/06/2008  DATE OF DISCHARGE:                       PSYCHIATRIC ADMISSION ASSESSMENT   This is a 50 year old single white male voluntarily admitted on October 06, 2008.  The patient states he is here because he states his medications  were not working, and he had a stupid notion to drink and use cocaine  to help him with his depression and hallucinations that had gotten  worse.  Experiencing auditory hallucinations, derogatory in nature.  His  last drink was on Thursday prior to this admission.  He is reporting  drinking a fifth at a time.  He states he does not drink every day.  He  wants help.  He has not been sleeping well.  Denies any other  significant stressors.   PAST PSYCHIATRIC HISTORY:  The patient was here approximately a year  ago.  States he has been here for the past two Christmases.  He is a  client at [facility].   SOCIAL HISTORY:  The patient lives alone in [place].  He is disabled  due to psychiatric illness.   FAMILY HISTORY:  None.   ALCOHOL AND DRUG HISTORY:  As above.  Denies any seizure activity.  Has  recently been using alcohol and cocaine.   PRIMARY CARE Karisma Meiser:  None.   MEDICAL PROBLEMS:  Denies any acute or chronic health issues.   MEDICATION:  1. Has been taking Seroquel 600 mg.  2. Depakote 500 mg.  3. Simvastatin is listed for cholesterol.   DRUG ALLERGIES:  No known allergies.   PHYSICAL EXAMINATION:  GENERAL:  This is a middle-aged male who was  assessed at the [facility] emergency department.  Physical exam was  reviewed with no significant findings.  He appears in no acute distress  today and offers no complaints.   LABORATORY DATA:  CBC within normal  limits.  Urine drug screen is  positive for cocaine and positive for THC.  WBC on his urine shows 7-10.  Alcohol level less than 5.  BMET within normal limits.   MENTAL STATUS EXAM:  He is fully alert and cooperative, fair eye  contact.  He is appropriately dressed.  Speech is clear, normal pace and  tone.  The patient's mood appears somewhat depressed and grim affect,  but he is asking for help and agreeable to recommendations.  Thought  processes - again endorsing auditory hallucinations but denies any  currently.  Denies any current suicidal thoughts and does not appear to  be actively responding to internal stimuli.  Cognitive function intact.  His memory appears intact.  Judgment and insight are fair.   IMPRESSION:  AXIS I:  Polysubstance abuse.  Substance-induced mood  disorder.  AXIS II:  Deferred.  AXIS III:  AXIS IV:  Deferred at this time.  AXIS V:  Current is 35.   PLAN:  Our plan is to continue with the Depakote and Seroquel.  We will  also add Ambien for sleep.  We will continue to assess comorbidities and  support group and reinforce med compliance and follow up at the [facility].   TENTATIVE LENGTH OF STAY:  Three to five days.      Landry Corporal, N.P.      Anselm Jungling, MD  Electronically Signed    JO/MEDQ  D:  10/07/2008  T:  10/07/2008  Job:  [phone]

## 2010-09-14 NOTE — H&P (Signed)
NAMEMERCY, MALENA              ACCOUNT NO.:  [id number]   MEDICAL RECORD NO.:  [id number]          PATIENT TYPE:  IPS   LOCATION:  0501                          FACILITY:  BH   PHYSICIAN:  Geoffery Lyons, M.D.      DATE OF BIRTH:  1960-10-31   DATE OF ADMISSION:  12/20/2008  DATE OF DISCHARGE:                       PSYCHIATRIC ADMISSION ASSESSMENT   A 50 year old male voluntarily admitted.   HISTORY OF PRESENT ILLNESS:  The patient to present to the [facility]  emergency department, asking to come to our facility relapsing on crack  cocaine, feeling suicidal.  Today states that he feels suicidal but not  sure how he would hurt himself.  Admits to using cocaine and  drinking  alcohol, wanting to get better for himself.   PAST PSYCHIATRIC HISTORY:  The patient was here in June 2010.  He sees  Dr. Dorna Leitz who is the psychotherapist and is a client at [facility]  mental health.  He was discharged in June on Depakote, Seroquel and  trazodone.   SOCIAL HISTORY:  A 50 year old single male.  He lives in [place],  lives alone.  He is on disability for mental issues.  States he was in  prison for 7 years for armed robbery, not currently on probation.  Denies any current legal charges. 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   FAMILY HISTORY:  None.   ALCOHOL AND DRUG HISTORY:  As above.   [facility]   MEDICAL PROBLEMS:  Denies any acute or chronic health issues.   MEDICATIONS:  Was discharged again on Depakote, Seroquel, and trazodone,  Seroquel 600 mg at bedtime, with 50 mg one at 8 and one at 12 noon.   DRUG ALLERGIES:  No known allergies.   PHYSICAL EXAMINATION:  This is a middle-aged male assessed at [facility] emergency department.  Physical exam with no significant findings.  He appears in no distress today, offers no complaints.  Vital signs are  stable.  O2 saturation 97%.   LABORATORY DATA:  Shows alcohol level less than 5.  Valproic acid level  less than 10.  Urine drug screen positive for cocaine.  Glucose of 107.   MENTAL STATUS EXAM:  The patient is fully alert and cooperative with  poor eye contact.  He is somewhat irritable, currently dressed in  hospital gown, seems to have a limited insight and judgment.  His  thought processes are coherent and goal directed.  Memory appears  intact.   Axis I:  Mood disorder, polysubstance abuse.  Axis II:  Deferred.  Axis III:  No known medical conditions.  Axis IV:  Psychosocial problems related to chronic substance use.  Poor  social support.  Axis V:  Current is 40 - 45.   PLAN:  Contact for safety.  Stabilize mood and thinking.  We will resume  patient's Depakote and Seroquel.  Will check levels periodically.  Reinforce medication compliance.  Work on relapse prevention.  Continue  to identify support group.  Discuss long-term rehab programs.  The  patient be in the dual diagnosis program.  Tentative  length of stay at  this time is 3-5 days.      Landry Corporal, N.P.      Geoffery Lyons, M.D.  Electronically Signed    JO/MEDQ  D:  12/22/2008  T:  12/22/2008  Job:  [id number]
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
## 2010-09-14 NOTE — Consult Note (Signed)
NAMEKIRON, OSMUN              ACCOUNT NO.:  [id number]   MEDICAL RECORD NO.:  [id number]          PATIENT TYPE:  EMS   LOCATION:  ED                           FACILITY:  [facility]   PHYSICIAN:  Antonietta Breach, M.D.  DATE OF BIRTH:  1960-08-13   DATE OF CONSULTATION:  10/05/2008  DATE OF DISCHARGE:                                 CONSULTATION   REASON FOR CONSULTATION:  Psychosis, depression, cocaine and alcohol  relapse.   REQUESTING PHYSICIAN:  Lobbyist.   HISTORY OF PRESENT ILLNESS:  [name] is a 50 year old male  who had presented to [facility] with a 10-day history of return  derogatory auditory hallucinations that have escalated.  He did relapse  on cocaine 2 days ago.  He has also relapsed on alcohol and has been  drinking alcohol about every 3 days.  He states that he has been living  in an apartment complex where there are a number of people who are  addicted to drugs and alcohol making it very difficult for him to remain  abstinent.  He also has stopped taking his psychiatric medication which  has consisted of Seroquel and Depakote.   His orientation is intact.  His memory function is intact.  He is pacing  up and down the hallway.  He is re-directable and nonviolent.  His face  is very downcast and he makes little eye contact.   PAST PSYCHIATRIC HISTORY:  [name] does have a history of periods of  multiple days of elevated mood, increased energy, and decreased need for  sleep without stimulants.  He also has a history of cutting his wrists.   He states that he definitely hears voices when his mood is elevated as  well as depressed.  The voices are negative when he is depressed.  He  has been diagnosed with schizoaffective disorder in the past.   When asked if he ever has hallucinations when his mood is normal, he is  not sure but he knows he has been diagnosed with schizoaffective  disorder in the past.   He states that his  medications of Seroquel and Depakote were effective  except he would still have insomnia at times.   He has been admitted to the [facility] a number of  times.  His last admission there was on April 28, 2008.  At that  time, he was suicidal and he was also making threats to harm others.  He  was having hallucinations of an auditory nature.  He had been off his  medications again and was drinking alcohol and smoking cocaine.   Also listed in the past medical record was an overdose on Benadryl in  December of 2008.   His Depakote dosage was 500 mg daily.  Seroquel was 300 mg b.i.d.   FAMILY PSYCHIATRIC HISTORY:  None known.   SOCIAL HISTORY:  [name] is medically disabled and unemployed.  He has  2 children, one he has never bed.  He is never been married.  He is  currently living in an apartment complex.  Please  see the above  regarding substances.   PAST MEDICAL HISTORY:  He has a history of hypercholesterolemia and has  been taking a statin.  He has a history of hepatitis C.  He has  undergone an appendectomy and shoulder surgery.   He has NO KNOWN DRUG ALLERGIES.   MEDICATIONS:  His MAR was reviewed.  So far, he has received 2 mg of  Ativan this morning which has not been effective.   LABORATORY DATA:  Sodium 140, BUN 16, creatinine 1.21, glucose 110,  alcohol negative.  WBC 5.4, hemoglobin 15.7, platelet count 208,000.   REVIEW OF SYSTEMS:  CONSTITUTIONAL, HEAD, EYES, EARS, NOSE, THROAT,  MOUTH. NEUROLOGIC, PSYCHIATRIC, CARDIOVASCULAR, RESPIRATORY,  GASTROINTESTINAL, GENITOURINARY, SKIN, MUSCULOSKELETAL, HEMATOLOGIC,  LYMPHATIC, ENDOCRINE, METABOLIC all unremarkable.   EXAMINATION:  VITAL SIGNS:  Temperature 98.4, pulse 89, respiratory rate  20, blood pressure 142/76.  GENERAL APPEARANCE:  [name] is a middle-aged male looking  approximately 10 years younger than his chronologic age, pacing back-and-  forth in the hallway.  He is re-directable to  sit down in his chair.  His facies are downcast.  He has no abnormal involuntary movements.   MENTAL STATUS EXAM:  [name] looks down at the floor most of the time  during the interview.  His affect is constricted.  His mood is anxious.  His attention span is mildly decreased.  Concentration is moderately  decreased.  He is oriented to all spheres.  His memory is intact to  immediate recent and remote.  Fund of knowledge and intelligence are  within normal limits.  His speech is nonpressured.  There is no  dysarthria.  Thought process is coherent.  Thought content:  He has  suicidal thoughts.  He also has negative auditory hallucinations.  His  insight is partial judgment impaired.   ASSESSMENT:  AXIS I:  293.83  Mood disorder not otherwise specified,  rule out schizoaffective disorder.  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  Rule out polysubstance dependence with recent relapse on alcohol and  cocaine.   [name] may have a primary diagnosis of schizoaffective disorder.  However, his history is complicated with his relapse in recreational  substance use.   AXIS II:  Deferred.  AXIS III:  See past medical history.  AXIS IV:  Primary support group.  AXIS V:  20.  Please place this statement under AXIS I:  And a   That the undersigned provided ego supportive psychotherapy and  education.   [name] is at risk for suicide.  Also, he has relapsed on alcohol and  cocaine.   The indications, alternatives, and adverse effects of Seroquel and  Depakote were discussed.  He understands and wants to restart them.   RECOMMENDATIONS:  1. Would continue low stimulation ego support.  2. Would admit to an inpatient psychiatric unit for a dual diagnosis      track.  3. Would start Seroquel at 25 mg p.o. b.i.d. and 100 mg q.h.s.  4. Would utilize Ativan 1-4 mg p.o. IM or IV q.4 h. p.r.n. agitation      and nocturnal insomnia.      Antonietta Breach,  M.D.  Electronically Signed     JW/MEDQ  D:  10/06/2008  T:  10/06/2008  Job:  [id number]
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
## 2010-09-15 LAB — BENZODIAZEPINE, QUANTITATIVE, URINE: Temazepam GC/MS Conf: NEGATIVE NG/ML

## 2010-09-15 NOTE — Discharge Summary (Signed)
  NAMEEHREN, [name]              ACCOUNT NO.:  [id number]  MEDICAL RECORD NO.:  [id number]           PATIENT TYPE:  I  LOCATION:  0301                          FACILITY:  [facility]  PHYSICIAN:  Anselm Jungling, MD  DATE OF BIRTH:  November 18, 1960  DATE OF ADMISSION:  09/08/2010 DATE OF DISCHARGE:  09/14/2010                              DISCHARGE SUMMARY   IDENTIFYING DATA/REASON FOR ADMISSION:  This was an inpatient psychiatric admission for [name], a 50 year old single Caucasian male who was admitted for alcohol dependence.  Please refer to the admission note for further details pertaining to the symptoms, circumstances and history that led to his hospitalization.  He was given an initial Axis I diagnoses of alcohol dependence and schizoaffective disorder.  MEDICAL AND LABORATORY:  The patient was medically and physically assessed by the Psychiatric Nurse Practitioner.  He was in good health without any active or chronic medical problems.  Liver function tests were within normal limits.  Urine drug screen was negative.  Alcohol level on admission was 0.15.  There were no significant medical issues during his stay outside of his alcohol detoxification.  HOSPITAL COURSE:  The patient was admitted to the Adult Inpatient Psychiatric Service.  He presented as a well-nourished, normally- developed adult male who was pleasant and cooperative.  He had been with [place] many times previously.  He readily acknowledged his loss of control over alcohol use and need for treatment.  He was detoxified using a standard Librium taper.  In addition, he was continued on his usual Seroquel, Depakote and trazodone for bipolar disorder.  His bipolar disorder was not in evidence during this stay.  He participated in therapeutic groups and activities, and was a good participant throughout.  His detoxification was uneventful, and, in fact, he was able to tolerate a more rapid reduction of Librium then is  standard.  He worked closely with case management towards an aftercare plan.  He eventually agreed to follow.  AFTERCARE:  The patient was to follow up at [facility], with an intake appointment for their residential services on the morning of Sep 14, 2010.  DISCHARGE MEDICATIONS: 1. Seroquel 700 mg q.h.s. 2. Trazodone 200 mg q.h.s. 3. Depakote 1500 mg q.h.s.  DISCHARGE DIAGNOSES:  AXIS I:  Alcohol dependence and schizoaffective disorder, not otherwise specified. AXIS II:  Deferred. AXIS III:  No acute or chronic illnesses. AXIS IV:  Stressors severe. AXIS V:  Global Assessment of Functioning on discharge 45.     Anselm Jungling, MD     SPB/MEDQ  D:  09/13/2010  T:  09/13/2010  Job:  [id number]  Electronically Signed by Geralyn Flash MD on 09/15/2010 02:01:38 PM

## 2010-09-17 NOTE — Discharge Summary (Signed)
[name], [name]              ACCOUNT NO.:  [id number]   MEDICAL RECORD NO.:  [id number]          PATIENT TYPE:  IPS   LOCATION:  0501                          FACILITY:  BH   PHYSICIAN:  Geoffery Lyons, M.D.      DATE OF BIRTH:  1960/06/06   DATE OF ADMISSION:  10/18/2008  DATE OF DISCHARGE:  10/22/2008                               DISCHARGE SUMMARY   CHIEF COMPLAINT:  This was one of several admissions to [facility] for this 50 year old single white male discharged from  the [facility] October 13, 2008.  He was brought to the emergency  room with complaints of chest pain.  He was found to have cocaine in his  urine, reportedly relapsing on crack cocaine.  He told EMS he may as  well kill himself as he cannot go on living like this.   PAST PSYCHIATRIC HISTORY:  Admitted from October 06, 2008 to October 13, 2008  and a year prior to this admission.  Being follow by the [facility].   ALCOHOL/DRUG HISTORY:  History of alcohol abuse as well as cocaine, as  of recently cocaine.  Has been using alcohol since age 38, crack age 28.   MEDICAL HISTORY:  Hypercholesterolemia.   PHYSICAL EXAMINATION:  Failed to show any acute findings.   MENTAL STATUS EXAM:  Reveals alert cooperative male, appropriately  groomed, dressed and nourished.  Speech is clear, normal rate, tempo and  production.  Mood is depressed.  Affect depressed.  Endorsed that he is  overwhelmed with what he went through, very scared of relapsing on  cocaine after he went through the chest pain.  Wanting help.  No active  suicide or homicide ideas, no delusions, no hallucinations.  Cognition  well preserved.   LABORATORY DATA:  UDS positive for cocaine.  Other laboratory within  normal limits.   ADMISSION DIAGNOSES:  AXIS I:  Mood disorder not otherwise specified.  Cocaine dependence, alcohol abuse.  AXIS II:  No diagnosis.  AXIS III:  Hypercholesterolemia.  AXIS IV:  Moderate.  AXIS V:  Upon  admission 35, highest global assessment of functioning in  the last year 60.   COURSE IN THE HOSPITAL:  He was admitted.  He was started on individual  and group psychotherapy.  As already stated, he is a 50 year old male  recently discharged.  Admits he was too conceited and  went around the  wrong crowd.  Endorsed that his medication were working really well.  He  just exerted poor judgment and relapsed on cocaine, strong stuff.  Experienced chest pain radiating.  Came to the emergency room after he  was having an MI.  He was assessed to be secondary to cocaine.  He asked  to come back to the unit to get himself back together.  He was placed  back on his medications.  Depakote 500 at bedtime; Seroquel 50 twice a  day and 600 at bedtime; Zocor 20 mg per day and trazodone 100 at bedtime  for sleep.  In the next few days, he continued to improve.  Endorsed he  wanted to get his life back together.  We worked on relapse prevention  plan.  Endorsing he learned a lot.  He was going to avoid going to  those people.  The girlfriend was supportive.  For the time being, he  was going to be staying with his mother in [place] until he can get  another placement as he planned to move away from his surroundings that  were influencing his relapsing   DISCHARGE DIAGNOSES:  AXIS I:  Cocaine dependence, alcohol abuse,  bipolar disorder.  AXIS II:  No diagnosis.  AXIS III:  Hypercholesterolemia.  AXIS IV:  Moderate.  AXIS V:  Upon discharge 50.   DISCHARGE MEDICATIONS:  1. Depakote 500 mg at bedtime.  2. Seroquel 50 twice a day and 600 mg at bedtime.  3. Zocor 20 mg per day.  4. Trazodone 100 mg at bedtime.   FOLLOW UP:  [facility].      Geoffery Lyons, M.D.  Electronically Signed     IL/MEDQ  D:  11/18/2008  T:  11/18/2008  Job:  [id number]

## 2010-09-17 NOTE — Consult Note (Signed)
[name], [name]              ACCOUNT NO.:  [id number]   MEDICAL RECORD NO.:  [id number]          PATIENT TYPE:  EMS   LOCATION:  MAJO                         FACILITY:  [facility]   PHYSICIAN:  Lonia Blood, M.D.DATE OF BIRTH:  08-13-1960   DATE OF CONSULTATION:  11/25/2005  DATE OF DISCHARGE:                                   CONSULTATION   REFERRING PHYSICIAN:  Dr. Mariel Aloe.   REASON FOR CONSULTATION:  Chest pain - questionable angina.   HISTORY OF PRESENT ILLNESS:  Mr. Kylin Dubs is a 50 year old gentleman  who lives in [place]. He is a self-admitted cocaine addict. He has been  smoking crack cocaine for approximately two years now. Last night he was  smoking crack and began to experience chest pain. He describes the pain as a  sharp stinging sensation in his left upper shoulder and right upper  shoulder. This was worse with inhalation from the crack pipe. It was worse  with deep breaths or coughs. It has intensified with change of position. It  was not associated with exertion. Shortly thereafter he began to experience  palpitations. He felt that his heart was beating rapidly and irregularly.  This caused a general sense of unwellness. The patient admitted that this  frightened him, and therefore he presented to the ER for evaluation. At the  present time he complains of mild sharp pain in his rib cage. As noted  above this pain is worse with deep breaths or sudden movements. This pain is  not consistent with angina. There is no dyspnea on exertion. There is no  diaphoresis. The patient no longer feels palpitations. There has been no  nausea or vomiting. There is no abdominal pain. There is no lower extremity  weakness.   REVIEW OF SYSTEMS:  At the present time Mr. Elsen reports that he is back to  normal with the exception to the mild persistent pleuritic-type pain as  described above. Comprehensive Review of Systems is, otherwise,  unremarkable.   PAST  MEDICAL HISTORY:  1. Status post appendectomy secondary to appendicitis.  2. Status post ganglion cyst excision of the right hand.  3. Tobacco abuse.  4. Alcohol abuse.  5. Cocaine abuse.   PRESCRIPTION MEDICATIONS:  None.   ALLERGIES:  No known drug allergies.   FAMILY HISTORY:  Patient's father is alive with hypertension. Patient's  mother is alive and has diabetes. The patient has multiple siblings who are  all healthy. There is specifically no history of coronary artery disease in  the patient's family that he is able to recollect.   SOCIAL HISTORY:  The patient is single. He lives in [place]. He has  children who are grown and healthy. He works with a Materials engineer in  Holiday representative work.   LABORATORY DATA REVIEW:  CK is elevated at 574 with MB 14.6, troponin less  than 0.05. Myoglobin 99.   Chest x-ray reveals linear subsegmental atelectasis in the right upper lobe.  A 12-lead EKG is sinus rhythm with a right bundle branch block with no  appreciable or T wave changes. BMET is unremarkable.  pH 7.37 with PCO2 38.  Chest x-ray reveals linear subsegmental atelectasis in the right upper lobe,  but, otherwise, there is no acute disease.   PHYSICAL EXAMINATION:  VITAL SIGNS:  Temperature 97.7, blood pressure  129/76, heart rate 51, respiratory rate 22, O2 sat 97% on room air.  GENERAL:  Well-developed, well-nourished male in no acute respiratory  distress.  HEENT:  Normocephalic, atraumatic, pupils equal, round, reactive to light  and accommodation. Extraocular muscles intact bilaterally. OC/OP clear.  NECK:  No JVD, no lymphadenopathy.  LUNGS:  Clear to auscultation without wheezes or rhonchi.  CHEST:  Tenderness to palpation over the left apical and right apical rib  cages with no crepitance and no underlying erythema.  CARDIOVASCULAR:  Regular rate and rhythm without murmur, gallop or rub,  normal S1 and S2.  ABDOMEN:  Thin, nontender, nondistended, soft, bowel sounds  present. No  hepatosplenomegaly, no rebound, no ascites.  EXTREMITIES:  No significant cyanosis, clubbing or edema bilaterally in the  lower extremities.  NEUROLOGIC:  Alert and oriented x4. Cranial nerves II-XII intact  bilaterally. 5/5 strength bilaterally upper and lower extremities.   IMPRESSION:  1. Pleuritic chest pain - the patient's history is not consistent with      angina in any way by interview. The sudden onset of his symptoms at the      time of inhaling crack cocaine is consistent with pleuritis secondary      to irritation from the inhaled substance. The character of the pain,      its location, and its occurrence in time all point to a clear diagnosis      of pleural irritation. There is no significant family history of      coronary artery disease. The patient has unknown lipid status. He does      smoke, and he is male. He is not hypertensive. He is not diabetic. The      likelihood of coronary artery disease is minimal in this patient.      Furthermore, as noted his symptoms are not consistent with angina. I do      not feel that admission for rule out of MI or for risk stratification      for coronary artery disease is necessary. I have advised the patient      that his symptoms are directly related to his crack cocaine abuse. I      have advised him to discontinue using crack immediately.  2. Cocaine abuse - I have counseled the patient extensively as to the very      serious health risks associated with crack cocaine or inhalation      cocaine. I have explained to him that even though I do not think that      he has coronary artery disease, that ongoing use of crack or cocaine of      any form can result in sudden cardiac death, acute severe myocardial      infarction, and even stroke in otherwise healthy individuals. I have      advised him of the multiple other deleterious effects of ongoing     cocaine abuse. The patient reports that he is interested in  abstention.      He has abstained for long periods in the past, and simply restarted      using cocaine approximately two years ago. Case manager consult has      been requested, and the patient will be educated on all the different  facilities available in the community to help assure that he continues      to abstain from cocaine.  3. Tobacco abuse - I have counseled the patient as to the multiple      deleterious effects of ongoing tobacco abuse. I have advised him that      he should discontinue immediately. He has been counseled on the      different mechanisms by which he can accomplish this.  4. Alcohol abuse - I have counseled the patient extensively as to the      multiple deleterious effects of ongoing alcohol abuse. I have advised      him that he should discontinue drinking altogether. I have informed him      that the highly publicized one drink a day theory is not appropriate      for him and that in the setting of obvious substance abuse that his      best strategy will be complete abstention.   At the present Mr. Pfannenstiel is stable. Vital signs are unremarkable. EKG is  without acute changes. The patient's elevated CKs can be easily explained by  myositis and pleuritis from his crack cocaine abuse. I feel it is safe to  discharge him from the hospital. He will be connected with the appropriate  facilities to help him in kicking his drug habit.      Lonia Blood, M.D.  Electronically Signed     JTM/MEDQ  D:  11/25/2005  T:  11/25/2005  Job:  [id number]   cc:   Sheppard Penton. Stacie Acres, M.D.

## 2010-09-17 NOTE — Discharge Summary (Addendum)
[name], [name]              ACCOUNT NO.:  [id number]   MEDICAL RECORD NO.:  [id number]          PATIENT TYPE:  IPS   LOCATION:  0500                          FACILITY:  [facility]   PHYSICIAN:  Geoffery Lyons, M.D.      DATE OF BIRTH:  July 17, 1960   DATE OF ADMISSION:  04/24/2007  DATE OF DISCHARGE:  04/28/2007                               DISCHARGE SUMMARY   CHIEF COMPLAINT:  This was the first admission to [facility] for this 50 year old male who presented with an intentional  overdose on Benadryl taking 40-50 tablets the night before.  __________  the people have work, they would have taken him away for his girlfriend  had called 9-1-1 feeling very overwhelmed, has been abusing substances,  drinking up to 30-40 ounces of beer per day, also doing crack cocaine.  Previous using cocaine 1996.  Endorsing mood swings, feeling manic at  one time and depressed the other.  Endorsed that he gets energetic, gets  high.  Endorsed that he lost his job and he is hanging around with the  wrong crowd.  Endorsed sobriety 6 years.   PAST PSYCHIATRIC HISTORY:  First time at Behavior Health.  He was in  rehab in 2006 in [place]. He was placed on Risperdal and Depakote at  that time as well detoxing in [place] for 21 days and was sober  for 27-months.  __________ history as already stated,  persistent use of  substances mostly alcohol and cocaine.   PAST MEDICAL HISTORY:  Chronic pain, neck and shoulder.   MEDICATIONS:  Has been on Depakote and Risperdal, none for the last  week.   REVIEW OF SYSTEMS:  Review of systems performed and failed to show any  acute findings.   LABORATORY WORKUP:  UDS positive for cocaine, __________ 157, glucose  102.   ____ QA MARKER: 174 ____EXAMIN  cooperative male, casually dressed.  Fleeting eye contact.  Speech was  clear.  Normal __________ production, though he was feeling rough.  At  the present time __________.  No evidence of  delusions.  No active  suicidal or homicidal ideas, no hallucinations.  Cognition well-  preserved.   ADMISSION DIAGNOSES:  AXIS I:       Rule-out bipolar disorder, major  depression, recurrent alcohol and cocaine abuse, rule-out dependence.  AXIS II:    No diagnosis.  AXIS III:  Chronic pain, neck and shoulder.  AXIS IV:    Moderate.  GAF upon admission 30, GAF in the last year 60   COURSE IN THE HOSPITAL:  Patient was admitted.  He was started  individual and group psychotherapy.  He was slowly placed back on the  medications that he was taking before.  As already stated he took an  overdose of Benadryl  claiming he was not sleeping.  He started  vomiting.  Claimed he was upset.  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  Before  that he was drinking every couple of weeks, being  around people who  drank. Endorsed use of crack cocaine, 30 dollars per day.  Endorsed  aches and pain, shoulder, neck, back and endorsed degenerative  arthritis, endorsed mood swings, feeling energetic, real manic and  depression, hard to go to sleep.  In 1995 and 2005 he was locked up and  went through rehab.  While he was there alcohol seemed to trigger the  cocaine.  He uses cocaine to be awake. December 24 continued difficulty  with mood, admits to episodes of irritability, anger, was wanting to  leave the hospital, but he was encouraged by his family is to stay and  do everything he needed to do to take care of himself.  December 25 he  was tolerating the medications,  feeling mood swings.  Even  for 6 years  with no alcohol or drugs he experienced mood swings.  He did say that he  had some good results with his Depakote and the Risperdal.  Hears voices  and sees __________ when he was off medications, so we pursued the  Depakote and the Risperdal further.  By December  26 felt that the  medications were working well, he was committed to abstinence and make  this work for him. Endorsed that he knows that if he stays on medication  he does well and willing to do that.  So by December 27 he was in full  contact with reality.  He was ready for discharge, planning to attend AA  and NA to prevent relapse.  Mood improved.  Affect brighter. Willing to  pursue outpatient treatment.   DISCHARGE DIAGNOSES:  AXIS I:       Bipolar disorder, alcohol and  cocaine abuse.  AXIS II:    No diagnosis.  AXIS III:   Chronic pain, chronic back, neck and shoulder pain.  AXIS IV:    Moderate.  AXIS V:     Upon discharge 55-60.   Discharged on Depakote ER 500 at bedtime,  250 twice daily, Risperdal  0.5 twice daily and 1 mg at bedtime and 1 more Librium 25 at 2:00 p.m.  to complete detox. Follow-up Dr. Lang Snow at [facility].      Geoffery Lyons, M.D.  Electronically Signed     IL/MEDQ  D:  05/14/2007  T:  05/15/2007  Job:  [id number]

## 2010-09-17 NOTE — Discharge Summary (Signed)
[name], [name]              ACCOUNT NO.:  [id number]   MEDICAL RECORD NO.:  [id number]          PATIENT TYPE:  IPS   LOCATION:  0407                          FACILITY:  [facility]   PHYSICIAN:  Jasmine Pang, M.D. DATE OF BIRTH:  12-03-1960   DATE OF ADMISSION:  10/06/2008  DATE OF DISCHARGE:  10/13/2008                               DISCHARGE SUMMARY   IDENTIFICATION:  This is a 50 year old single white male who was  admitted on a voluntary basis on October 06, 2008.   HISTORY OF PRESENT ILLNESS:  The patient states he is here because his  medications are not working.  He said he had notion to drink and use  cocaine they help him with his depression.  His hallucinations then  worsened.  He has been experiencing auditory hallucinations, derogatory  in nature.  His last drink was on the day prior to admission.  He is  reporting drinking a fifth of liquor at a time.  He states he does not  drink every day.  He wants help.  He has not been sleeping well.  He  denies any other significant stressors.  The patient was here  approximately a year ago.  He states he has been here at the past 2  Christmas.  He is a client at [facility].  For  further admission information, see psychiatric admission assessment.  Initially, he was given an axis I diagnosis of polysubstance abuse and  substance-induced mood disorder.  He had no diagnosis on axis III.   PHYSICAL FINDINGS:  This is a middle-aged male who was fully assessed at  the [facility] ED.  The physical exam was reviewed with no significant  findings.  He appears to be in no acute distress and offers no  complaints.   DIAGNOSTIC STUDIES:  CBC was within normal limits.  Urine drug screen  was positive for cocaine and positive for THC.  WBC on his urine shows 7-  10.  Alcohol level was less than 5.  BMET was within normal limits.   HOSPITAL COURSE:  Upon admission, the patient was continued on his home  medications  of Seroquel 300 mg b.i.d., Depakote 500 mg at bedtime, and  simvastatin 20 mg daily.  He was also started on trazodone 50 mg p.o.  q.h.s.  May repeat x1 and Librium 25 mg p.o. q.6 hours, p.r.n. symptoms  of withdrawal.  He was also started on 21 mg nicotine patch.  On October 07, 2008, trazodone was discontinued.  He was started on Ambien 10 mg p.o.  q.h.s., p.r.n.  Seroquel was discontinued and instead he was given 600  mg at bedtime.  He was also started on Seroquel 50 mg p.o. q.4 hours,  p.r.n. agitation and anxiety.  Initially in individual sessions, the  patient was alert and oriented x4. He was up and attending groups.  Nonpsychotic.  He had fair insight and judgment.  There was depressed  mood with a grim affect.  No active suicidal ideation, and he stated he  wanted help.  He felt that Seroquel  was helpful for him, but wanted some  routine daytime Seroquel.  He was started on Seroquel 50 mg q.a.m. and  every noon.  On October 09, 2008, the patient stated he felt that his  medications were working well.  He was complaining of some mild viral  like symptoms, including body aches and fatigue.  On October 11, 2008, he  was not feeling as well.  He stated I am feeling suicidal.  His sleep  and appetite were good.  He was worried about his discharge.  However,  on October 12, 2008, he was up and active in the middle of year, and he  felt rested. Affect was bright and cheerful.  He was looking for  discharge the following day and says he is ready.  He denied any  suicidal or homicidal ideation.  On October 13, 2008, sleep was good,  appetite was good.  Mood was euthymic.  Affect was consistent with mood.  There was no suicidal or homicidal ideation.  No thoughts of self-  injurious behavior.  No evidence of psychosis or thought disorder.  The  patient wanted to go home today and was felt to be safe for discharge.   DISCHARGE DIAGNOSES:  Axis I:  Mood disorder, not otherwise specified,  polysubstance  abuse.  Axis II:  None.  Axis III:  None.  Axis IV:  Moderate (problems with primary support group, burden of  psychiatric illness, burden of substance abuse).  Axis V:  Global assessment of functioning was 50 upon discharge.  GAF  was 35 upon admission.  GAF highest past year was 60.   DISCHARGE PLANS:  There was no specific activity level or dietary  restrictions.   POSTHOSPITAL CARE PLANS:  The patient will go to the [facility] on  October 22, 2008, at 10:30 a.m.  He will also see Dr. Lang Snow at the  [facility] on November 06, 2008, at 11:30 a.m.   DISCHARGE MEDICATIONS:  1. Depakote 500 mg at bedtime.  2. Seroquel 50 mg 1 in the morning and 1 at noon.  Seroquel 600 mg at      bedtime.  3. Simvastatin 20 mg daily.   He is to see his doctor for any medical problems and other  prescriptions.      Jasmine Pang, M.D.  Electronically Signed     BHS/MEDQ  D:  10/28/2008  T:  10/29/2008  Job:  [id number]

## 2010-10-14 NOTE — H&P (Signed)
[name], [name]              ACCOUNT NO.:  [id number]  MEDICAL RECORD NO.:  [id number]           PATIENT TYPE:  I  LOCATION:  0301                          FACILITY:  [facility]  PHYSICIAN:  Anselm Jungling, MD  DATE OF BIRTH:  1960-08-26  DATE OF ADMISSION:  09/08/2010 DATE OF DISCHARGE:                      PSYCHIATRIC ADMISSION ASSESSMENT   This is a 50 year old single white male.  He presented to the emergency department at [facility].  This was on yesterday, Sep 07, 2010.  He states that he cannot quit drinking, that he has no more medications. He states, "I am ready to kill someone if they do not leave me alone." Apparently the patient was in jail over the weekend.  He was charged with being drunk and disorderly.  He was in jail for 72 hours.  He finished Tuesday morning.  He promptly went out and bought some alcohol, so that he would have an alcohol level when he presented to the emergency room, as he would like to go to long-term substance abuse treatment.  He states that he recently changed his payee, but now his bills and backing up.  He states that since his last discharge from the [facility], and this would have been from his admission March 3 to July 10, 2010, he has been noncompliant with his medications and he never followed up with his appointments.  PAST PSYCHIATRIC HISTORY:  He states that at age 50 he was diagnosed as bipolar with mood swings.  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  He has never married.  He has two daughters and one who is 19, he has never seen. His other daughter is age 28.  He was started on disability in December 2008 and he currently lives alone in an apartment on [address]  FAMILY HISTORY:  He states his sister, who was  officially diagnosed as bipolar, died at age 98 from rheumatoid arthritis.  His paternal grandfather had been institutionalize down to [place].  No one would talk about it, so he is not exactly sure what issues his grandfather had.  ALCOHOL AND DRUG HISTORY:  He began drinking at age 24.  He states that he has periods where he can go for about 6 months without drinking.  He had one period from 1999 to 2006 when he stayed clean and sober.  He also uses crack.  This has been going on for years.  He cannot exactly state that the age he was when he started; and his last use of crack was prior to being hot jailed for being drunk and disorderly.  MEDICAL PROBLEMS:  He reports to me that he feels he has arthritis when he was last with [place] back in February.  He had no actual diagnoses.  He had reported hip pain.  MEDICATIONS:  Medications at the time of discharge June 11, 2010, he was discharged on Depakote 1500 mg h.s. Seroquel 700  mg h.s. and trazodone 100 mg h.s.  DRUG ALLERGIES:  He has no known drug allergies.  POSITIVE PHYSICAL FINDINGS:  His alcohol level was 150 in the emergency room.  This was due to him having just bought something to drink prior to presenting.  He had no substances other than alcohol.  His vital signs were stable.  He was afebrile 97.5 to 98.2.  His respirations were 16 to 18.  His pulse was 56 to 91 and blood pressure was 99/56 to 119/61.  He had no remarkable findings of the urine or CBC or of his metabolic profile.  His potassium was slightly low at 3.2 and interestingly enough, he did not have abnormal liver functions.  SGOT was 33, SGPT was 15.  MENTAL STATUS EXAM:  Today he is alert and oriented.  He is appropriately groomed, dressed and nourished.  His speech is not pressured.  His thought processes are clear, rational and goal oriented. Judgment and insight are intact.  Concentration and memory are intact. If in fact his dates are accurate, he  has a remarkable memory.  He reports that occasionally he hears his name or sometimes he hears crowds and noises.  He is not suicidal or homicidal.  DIAGNOSES:  Axis I:  Alcohol abuse; polysubstance abuse, to include crack; substance-induced mood disorder. AXIS II:  History for childhood abuse resulting in post-traumatic stress disorder. AXIS III:  None diagnosed. AXIS IV:  Issues with primary support group, economic issues; and he just will finish to 72-hour jail term for drunk and disorderly. AXIS V:  31.  PLAN:  The plan is to admit for safety and stabilization.  Eulogio Ditch, MD empirically restarted the Depakote ER 1500 mg h.s. and Seroquel 700 mg h.s. He also ordered Librium 50 mg at bedtime x3 days. The patient is interested in going to long-term substance abuse.  The case manager will address this in the morning.  Estimated length of stay is 2 to 3 days.     Mickie Leonarda Salon, P.A.-C.   ______________________________ Anselm Jungling, MD    MD/MEDQ  D:  09/08/2010  T:  09/09/2010  Job:  [id number]  Electronically Signed by Jaci Lazier ADAMS P.A.-C. on 09/20/2010 08:12:02 PM Electronically Signed by Nelly Rout MD on 10/14/2010 11:08:23 AM

## 2010-10-20 ENCOUNTER — Other Ambulatory Visit: Payer: Self-pay | Admitting: Family Medicine

## 2010-10-20 ENCOUNTER — Ambulatory Visit
Admission: RE | Admit: 2010-10-20 | Discharge: 2010-10-20 | Disposition: A | Discharge status: Home / Self Care | Payer: Medicare Other | Source: Ambulatory Visit | Attending: Family Medicine | Admitting: Family Medicine

## 2010-10-20 DIAGNOSIS — M542 Cervicalgia: Secondary | ICD-10-CM

## 2010-11-08 ENCOUNTER — Other Ambulatory Visit: Payer: Self-pay | Admitting: Neurosurgery

## 2010-11-08 DIAGNOSIS — M542 Cervicalgia: Secondary | ICD-10-CM

## 2010-11-09 ENCOUNTER — Other Ambulatory Visit: Payer: Medicare Other

## 2010-11-11 ENCOUNTER — Ambulatory Visit
Admission: RE | Admit: 2010-11-11 | Discharge: 2010-11-11 | Disposition: A | Discharge status: Home / Self Care | Payer: Medicare Other | Source: Ambulatory Visit | Attending: Neurosurgery | Admitting: Neurosurgery

## 2010-11-11 DIAGNOSIS — M542 Cervicalgia: Secondary | ICD-10-CM

## 2010-12-06 ENCOUNTER — Encounter (HOSPITAL_COMMUNITY)
Admission: RE | Admit: 2010-12-06 | Discharge: 2010-12-06 | Disposition: A | Discharge status: Home / Self Care | Payer: Medicare Other | Source: Ambulatory Visit | Attending: Neurosurgery | Admitting: Neurosurgery

## 2010-12-06 LAB — COMPREHENSIVE METABOLIC PANEL
AST: 14 U/L (ref 0–37)
Albumin: 3.7 g/dL (ref 3.5–5.2)
Alkaline Phosphatase: 56 U/L (ref 39–117)
BUN: 17 mg/dL (ref 6–23)
CO2: 30 mEq/L (ref 19–32)
Chloride: 104 mEq/L (ref 96–112)
Creatinine, Ser: 1.3 mg/dL (ref 0.50–1.35)
GFR calc non Af Amer: 58 mL/min — ABNORMAL LOW (ref >60)
Potassium: 3.8 mEq/L (ref 3.5–5.1)
Total Bilirubin: 0.2 mg/dL — ABNORMAL LOW (ref 0.3–1.2)

## 2010-12-06 LAB — SURGICAL PCR SCREEN: Staphylococcus aureus: POSITIVE — AB

## 2010-12-06 LAB — CBC
HCT: 39.1 % (ref 39.0–52.0)
MCV: 90.9 fL (ref 78.0–100.0)
Platelets: 170 10*3/uL (ref 150–400)
RBC: 4.3 MIL/uL (ref 4.22–5.81)
RDW: 12.5 % (ref 11.5–15.5)
WBC: 6.2 10*3/uL (ref 4.0–10.5)

## 2010-12-09 ENCOUNTER — Inpatient Hospital Stay (HOSPITAL_COMMUNITY)
Admission: RE | Admit: 2010-12-09 | Discharge: 2010-12-10 | DRG: 473 | Disposition: A | Discharge status: Home / Self Care | Payer: Medicare Other | Source: Ambulatory Visit | Attending: Neurosurgery | Admitting: Neurosurgery

## 2010-12-09 ENCOUNTER — Inpatient Hospital Stay (HOSPITAL_COMMUNITY): Payer: Medicare Other

## 2010-12-09 DIAGNOSIS — Z79899 Other long term (current) drug therapy: Secondary | ICD-10-CM

## 2010-12-09 DIAGNOSIS — F1410 Cocaine abuse, uncomplicated: Secondary | ICD-10-CM | POA: Diagnosis present

## 2010-12-09 DIAGNOSIS — B1920 Unspecified viral hepatitis C without hepatic coma: Secondary | ICD-10-CM | POA: Diagnosis present

## 2010-12-09 DIAGNOSIS — M109 Gout, unspecified: Secondary | ICD-10-CM | POA: Diagnosis present

## 2010-12-09 DIAGNOSIS — M5020 Other cervical disc displacement, unspecified cervical region: Principal | ICD-10-CM | POA: Diagnosis present

## 2010-12-09 DIAGNOSIS — M47812 Spondylosis without myelopathy or radiculopathy, cervical region: Secondary | ICD-10-CM | POA: Diagnosis present

## 2010-12-09 DIAGNOSIS — Z01812 Encounter for preprocedural laboratory examination: Secondary | ICD-10-CM

## 2011-01-12 ENCOUNTER — Ambulatory Visit
Admission: RE | Admit: 2011-01-12 | Discharge: 2011-01-12 | Disposition: A | Discharge status: Home / Self Care | Payer: Medicare Other | Source: Ambulatory Visit | Attending: Neurosurgery | Admitting: Neurosurgery

## 2011-01-12 ENCOUNTER — Other Ambulatory Visit: Payer: Self-pay | Admitting: Neurosurgery

## 2011-01-12 DIAGNOSIS — M5412 Radiculopathy, cervical region: Secondary | ICD-10-CM

## 2011-01-12 DIAGNOSIS — M5020 Other cervical disc displacement, unspecified cervical region: Secondary | ICD-10-CM

## 2011-01-20 LAB — BASIC METABOLIC PANEL
BUN: 26 — ABNORMAL HIGH
Calcium: 9.8
Creatinine, Ser: 1
GFR calc non Af Amer: >60
Glucose, Bld: 114 — ABNORMAL HIGH

## 2011-01-20 LAB — URINALYSIS, ROUTINE W REFLEX MICROSCOPIC
Glucose, UA: NEGATIVE
Ketones, ur: NEGATIVE
Nitrite: NEGATIVE
Protein, ur: NEGATIVE
Urobilinogen, UA: 0.2

## 2011-01-20 LAB — DIFFERENTIAL
Basophils Absolute: 0
Eosinophils Relative: 6 — ABNORMAL HIGH
Lymphocytes Relative: 33
Lymphs Abs: 2.6
Neutrophils Relative %: 52

## 2011-01-20 LAB — ETHANOL: Alcohol, Ethyl (B): <5

## 2011-01-20 LAB — RAPID URINE DRUG SCREEN, HOSP PERFORMED
Amphetamines: NOT DETECTED
Benzodiazepines: NOT DETECTED
Cocaine: NOT DETECTED
Tetrahydrocannabinol: NOT DETECTED

## 2011-01-20 LAB — CBC
HCT: 41.3
Platelets: 185
RDW: 12.5
WBC: 7.9

## 2011-01-20 LAB — VALPROIC ACID LEVEL: Valproic Acid Lvl: 22.8 — ABNORMAL LOW

## 2011-01-31 NOTE — Op Note (Signed)
NAMEDANY, WALTHER              ACCOUNT NO.:  [id number]  MEDICAL RECORD NO.:  [id number]  LOCATION:  3526                         FACILITY:  [facility]  PHYSICIAN:  Reinaldo Meeker, M.D. DATE OF BIRTH:  May 23, 1960  DATE OF PROCEDURE:  12/09/2010 DATE OF DISCHARGE:                              OPERATIVE REPORT   PREOPERATIVE DIAGNOSIS:  Herniated disk, spondylosis, C4-5, C5-6.  POSTOPERATIVE DIAGNOSIS:  Herniated disk, spondylosis, C4-5, C5-6.  PROCEDURE:  C4-5, C5-6 anterior cervical diskectomy with trabecular metal interbody fusion followed by Trinica anterior cervical plating.  SURGEON:  Reinaldo Meeker, MD  ASSISTANT:  Kathaleen Maser. Pool, MD  PROCEDURE IN DETAIL:  After placed in supine position and 5 pounds halter traction, the patient's neck was prepped and draped in the usual sterile fashion.  Localizing fluoroscopy was used prior to incision to identify the appropriate level.  Transverse incision was made in the right anterior neck, starting at the midline, headed towards the medial aspect of the sternocleidomastoid muscle.  The platysma muscle was then incised transversely.  The natural fascial plane between the strap muscles medially and sternocleidomastoid laterally was identified and followed down to the anterior aspect of cervical spine.  Longus colli muscles were identified, split in the midline, stripped away bilaterally with a Key dissector and unipolar coagulation.  Self-retaining retractor was placed for exposure.  X-ray showed approach to be at the appropriate levels.  Using a 15 blade, the herniated disk at C4-5 and C5-6 was incised.  Using pituitary rongeurs and curettes, approximately 90% disk material was removed.  High-speed drill was used to widen the interspace and bony shavings were shaved for use later in the case.  At this time, microscope was draped, brought into the field, and used for the remainder of the case.  Starting C5-6, the remainder of the  disk material and posterior longitudinal ligament was removed.  Ligament was then incised transversely and the cut edges removed with the Kerrison punch.  Thorough decompression of the spinal dura was carried out. Spondylitic spur in particular on the right side at this level was identified and removed to visualize and decompress the underlying C6 nerve root.  At this time, inspection was carried at this level for any evidence of residual compression, none could be identified.  Attention was then turned to C4-5.  Once again, posterior longitudinal ligament were removed.  Once again, thorough decompression was carried out with this level, greater concentration focused on the left side where the greater pathology was noted.  Thorough decompression was then carried out of the spinal dura and C5 nerve roots bilaterally.  At this time, inspection was carried out at both levels in all directions for any evidence of residual compression, none could be identified.  Large amounts of irrigation was carried out.  Measurements were taken and two 7-mm trabecular metal grafts were chosen.  These were filled with mixture of EquivaBone and autologous bone and then after irrigation was confirmed, they were packed in the disk space without difficulty and fluoroscopy showed to be in good position.  An appropriate length Trinica anterior cervical plate was then chosen.  Under fluoroscopic guidance, drill holes were placed followed by placing  14-mm screws x6. Locking mechanism was then rotated in the locked position.  Fluoroscopy showed good position of the plate, screws, and plugs.  Irrigation was carried out once more, any bleeding controlled with bipolar coagulation. The wound was then closed with inverted Vicryl on the platysma, inverted 5-0 PDS, subcu layer and Steri-Strips on the skin.  Sterile dressing and soft collar applied.  The patient was extubated and taken to recovery room in stable  condition.          ______________________________ Reinaldo Meeker, M.D.     ROK/MEDQ  D:  12/09/2010  T:  12/09/2010  Job:  [id number]  Electronically Signed by Aliene Beams M.D. on 01/31/2011 08:30:59 PM

## 2011-02-04 LAB — CBC
HCT: 42.8 % (ref 39.0–52.0)
HCT: 40.3
Hemoglobin: 14.4 g/dL (ref 13.0–17.0)
Hemoglobin: 14.1
MCHC: 35
MCV: 95.5 fL (ref 78.0–100.0)
RBC: 4.26
WBC: 9.2 10*3/uL (ref 4.0–10.5)

## 2011-02-04 LAB — RAPID URINE DRUG SCREEN, HOSP PERFORMED
Barbiturates: NOT DETECTED
Barbiturates: NOT DETECTED
Benzodiazepines: NOT DETECTED
Cocaine: POSITIVE — AB

## 2011-02-04 LAB — HEPATIC FUNCTION PANEL
ALT: 12 U/L (ref 0–53)
AST: 24
Alkaline Phosphatase: 50 U/L (ref 39–117)
Bilirubin, Direct: <0.1 mg/dL (ref 0.0–0.3)
Bilirubin, Direct: 0.1
Total Bilirubin: 0.5 mg/dL (ref 0.3–1.2)
Total Protein: 6.3

## 2011-02-04 LAB — BASIC METABOLIC PANEL
BUN: 12
Chloride: 114 mEq/L — ABNORMAL HIGH (ref 96–112)
GFR calc Af Amer: >60
GFR calc non Af Amer: >60 mL/min (ref >60)
GFR calc non Af Amer: >60
Glucose, Bld: 121 mg/dL — ABNORMAL HIGH (ref 70–99)
Potassium: 3.9 mEq/L (ref 3.5–5.1)
Potassium: 4.3
Sodium: 148 mEq/L — ABNORMAL HIGH (ref 135–145)
Sodium: 140

## 2011-02-04 LAB — DIFFERENTIAL
Eosinophils Absolute: 0.1 10*3/uL (ref 0.0–0.7)
Eosinophils Relative: 1 % (ref 0–5)
Lymphocytes Relative: 20 % (ref 12–46)
Lymphs Abs: 1.8 10*3/uL (ref 0.7–4.0)
Monocytes Absolute: 0.3 10*3/uL (ref 0.1–1.0)

## 2011-02-04 LAB — ETHANOL
Alcohol, Ethyl (B): 269 mg/dL — ABNORMAL HIGH (ref 0–10)
Alcohol, Ethyl (B): 157 — ABNORMAL HIGH

## 2011-02-04 LAB — SALICYLATE LEVEL: Salicylate Lvl: <4

## 2011-02-04 LAB — TRICYCLICS SCREEN, URINE: TCA Scrn: NOT DETECTED

## 2011-02-04 LAB — ACETAMINOPHEN LEVEL: Acetaminophen (Tylenol), Serum: <10 — ABNORMAL LOW

## 2011-02-18 ENCOUNTER — Other Ambulatory Visit: Payer: Self-pay | Admitting: Family Medicine

## 2011-02-18 ENCOUNTER — Ambulatory Visit
Admission: RE | Admit: 2011-02-18 | Discharge: 2011-02-18 | Disposition: A | Discharge status: Home / Self Care | Payer: Medicare Other | Source: Ambulatory Visit | Attending: Family Medicine | Admitting: Family Medicine

## 2011-02-18 DIAGNOSIS — M25569 Pain in unspecified knee: Secondary | ICD-10-CM
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
## 2011-10-21 ENCOUNTER — Encounter (HOSPITAL_COMMUNITY): Payer: Self-pay | Admitting: Emergency Medicine

## 2011-10-21 ENCOUNTER — Emergency Department (HOSPITAL_COMMUNITY)
Admission: EM | Admit: 2011-10-21 | Discharge: 2011-10-22 | Disposition: A | Discharge status: Home / Self Care | Payer: Medicare Other | Attending: Emergency Medicine | Admitting: Emergency Medicine

## 2011-10-21 DIAGNOSIS — F319 Bipolar disorder, unspecified: Secondary | ICD-10-CM | POA: Insufficient documentation

## 2011-10-21 DIAGNOSIS — F17200 Nicotine dependence, unspecified, uncomplicated: Secondary | ICD-10-CM | POA: Insufficient documentation

## 2011-10-21 DIAGNOSIS — F1910 Other psychoactive substance abuse, uncomplicated: Secondary | ICD-10-CM | POA: Insufficient documentation

## 2011-10-21 DIAGNOSIS — F259 Schizoaffective disorder, unspecified: Secondary | ICD-10-CM | POA: Insufficient documentation

## 2011-10-21 DIAGNOSIS — F1010 Alcohol abuse, uncomplicated: Secondary | ICD-10-CM | POA: Insufficient documentation

## 2011-10-21 HISTORY — DX: Bipolar disorder, unspecified: F31.9

## 2011-10-21 HISTORY — DX: Schizoaffective disorder, bipolar type: F25.0

## 2011-10-21 HISTORY — DX: Schizoaffective disorder, unspecified: F25.9

## 2011-10-21 HISTORY — DX: Unspecified viral hepatitis C without hepatic coma: B19.20

## 2011-10-21 LAB — BASIC METABOLIC PANEL
CO2: 27 mEq/L (ref 19–32)
Calcium: 10 mg/dL (ref 8.4–10.5)
GFR calc non Af Amer: 79 mL/min — ABNORMAL LOW (ref >90)
Potassium: 3.9 mEq/L (ref 3.5–5.1)
Sodium: 139 mEq/L (ref 135–145)

## 2011-10-21 LAB — CBC
Hemoglobin: 14.9 g/dL (ref 13.0–17.0)
MCH: 32.3 pg (ref 26.0–34.0)
MCV: 90.9 fL (ref 78.0–100.0)
Platelets: 217 10*3/uL (ref 150–400)
RBC: 4.61 MIL/uL (ref 4.22–5.81)

## 2011-10-21 LAB — ETHANOL: Alcohol, Ethyl (B): <11 mg/dL (ref 0–11)

## 2011-10-21 NOTE — ED Notes (Signed)
Pt states he has been fighting drug use for a while now and for the past week or so has been feeling suicidal  Pt has hx of same

## 2011-10-21 NOTE — ED Provider Notes (Signed)
History     CSN: [id number]  Arrival date & time 10/21/11  2151   First MD Initiated Contact with Patient 10/21/11 2305     11:58 PM HPI Patient reports he uses multiple substances. Reports using alcohol, cocaine, marijuana and reports drug abuse has caused difficulties with close personal relations. Reports this interim has caused severe depression and thoughts of suicide. Feels that he does drugs to "kill himself" denies hallucinations or delusions. Denies homicidal ideation. Patient is a 51 y.o. male presenting with mental health disorder. The history is provided by the patient.  Mental Health Problem The primary symptoms do not include delusions or hallucinations. This is a chronic problem.  The onset of the illness is precipitated by alcohol abuse and drug abuse. Sequelae of the illness include harmed interpersonal relations. Additional symptoms of the illness include agitation and poor judgment. He admits to suicidal ideas. He does not have a plan to commit suicide. He contemplates harming himself. He has not already injured self. He does not contemplate injuring another person. He has not already  injured another person. Risk factors that are present for mental illness include a history of mental illness and substance abuse.    Past Medical History  Diagnosis Date  . Hepatitis C   . Bipolar 1 disorder   . Schizophrenia, schizo-affective     Past Surgical History  Procedure Date  . Appendectomy   . Shoulder surgery   . Cervical spine surgery     Family History  Problem Relation Age of Onset  . Hypertension Mother   . Diabetes Father   . Hypertension Brother     History  Substance Use Topics  . Smoking status: Current Everyday Smoker    Types: Cigarettes  . Smokeless tobacco: Not on file  . Alcohol Use: Yes     occassional      Review of Systems  Psychiatric/Behavioral: Positive for suicidal ideas, behavioral problems and agitation. Negative for hallucinations  and self-injury.  All other systems reviewed and are negative.    Allergies  Review of patient's allergies indicates no known allergies.  Home Medications   Current Outpatient Rx  Name Route Sig Dispense Refill  . QUETIAPINE FUMARATE 100 MG PO TABS Oral Take 100 mg by mouth at bedtime. Take with two of the 300mg  tablets to equal 700mg  total.    . QUETIAPINE FUMARATE 300 MG PO TABS Oral Take 600 mg by mouth at bedtime. Take with a 100mg  to equal 700mg .    Marland Kitchen TRAZODONE HCL 100 MG PO TABS Oral Take 200 mg by mouth at bedtime.      BP 125/80  Pulse 78  Temp 98.4 F (36.9 C) (Oral)  Resp 18  SpO2 97%  Physical Exam  Constitutional: He is oriented to person, place, and time. He appears well-developed and well-nourished.  HENT:  Head: Normocephalic and atraumatic.  Eyes: Pupils are equal, round, and reactive to light.  Neurological: He is alert and oriented to person, place, and time.  Skin: Skin is warm and dry. No rash noted. No erythema. No pallor.  Psychiatric: His speech is normal. Blunt: Flat affect. He is withdrawn. He is not agitated and not aggressive. He expresses suicidal ideation. He expresses no homicidal ideation. He expresses no suicidal plans and no homicidal plans.    ED Course  Procedures  Results for orders placed during the hospital encounter of 10/21/11  CBC      Component Value Range   WBC 7.0  4.0 -  10.5 K/uL   RBC 4.61  4.22 - 5.81 MIL/uL   Hemoglobin 14.9  13.0 - 17.0 g/dL   HCT 40.9  81.1 - 91.4 %   MCV 90.9  78.0 - 100.0 fL   MCH 32.3  26.0 - 34.0 pg   MCHC 35.6  30.0 - 36.0 g/dL   RDW 78.2  95.6 - 21.3 %   Platelets 217  150 - 400 K/uL  BASIC METABOLIC PANEL      Component Value Range   Sodium 139  135 - 145 mEq/L   Potassium 3.9  3.5 - 5.1 mEq/L   Chloride 103  96 - 112 mEq/L   CO2 27  19 - 32 mEq/L   Glucose, Bld 115 (*) 70 - 99 mg/dL   BUN 17  6 - 23 mg/dL   Creatinine, Ser 0.86  0.50 - 1.35 mg/dL   Calcium 57.8  8.4 - 46.9 mg/dL   GFR  calc non Af Amer 79 (*) >90 mL/min   GFR calc Af Amer >90  >90 mL/min  ETHANOL      Component Value Range   Alcohol, Ethyl (B) <11  0 - 11 mg/dL    MDM   Discussed patient with Judeth Cornfield, ACT who will evaluate the patient.  Pt signed out to Dr Read Drivers       Thomasene Lot, PA-C 10/22/11 [phone]

## 2011-10-21 NOTE — ED Notes (Signed)
Patient was seen by security and wand.

## 2011-10-21 NOTE — ED Notes (Signed)
Pt could not urinate. 

## 2011-10-21 NOTE — ED Notes (Signed)
Pt belongings: clothes, shoes and wallet are placed in locker 2 triage.

## 2011-10-22 LAB — RAPID URINE DRUG SCREEN, HOSP PERFORMED
Benzodiazepines: NOT DETECTED
Cocaine: POSITIVE — AB
Opiates: NOT DETECTED
Tetrahydrocannabinol: POSITIVE — AB

## 2011-10-22 LAB — URINALYSIS, ROUTINE W REFLEX MICROSCOPIC
Bilirubin Urine: NEGATIVE
Glucose, UA: NEGATIVE mg/dL
Hgb urine dipstick: NEGATIVE
Specific Gravity, Urine: 1.026 (ref 1.005–1.030)

## 2011-10-22 MED ORDER — ACETAMINOPHEN 325 MG PO TABS: 650.0000 mg | ORAL_TABLET | ORAL | Status: DC | PRN

## 2011-10-22 MED ORDER — NICOTINE 21 MG/24HR TD PT24
21.0000 mg | MEDICATED_PATCH | Freq: Every day | TRANSDERMAL | Status: DC
  Administered 2011-10-22: 21 mg via TRANSDERMAL
  Filled 2011-10-22: qty 1

## 2011-10-22 MED ORDER — THIAMINE HCL 100 MG/ML IJ SOLN: 100.0000 mg | Freq: Every day | INTRAMUSCULAR | Status: DC

## 2011-10-22 MED ORDER — IBUPROFEN 600 MG PO TABS: 600.0000 mg | ORAL_TABLET | Freq: Three times a day (TID) | ORAL | Status: DC | PRN

## 2011-10-22 MED ORDER — ZOLPIDEM TARTRATE 5 MG PO TABS: 5.0000 mg | ORAL_TABLET | Freq: Every evening | ORAL | Status: DC | PRN

## 2011-10-22 MED ORDER — LORAZEPAM 2 MG/ML IJ SOLN: 1.0000 mg | Freq: Four times a day (QID) | INTRAMUSCULAR | Status: DC | PRN

## 2011-10-22 MED ORDER — LORAZEPAM 1 MG PO TABS: 1.0000 mg | ORAL_TABLET | Freq: Three times a day (TID) | ORAL | Status: DC | PRN

## 2011-10-22 MED ORDER — ADULT MULTIVITAMIN W/MINERALS CH
1.0000 | ORAL_TABLET | Freq: Every day | ORAL | Status: DC
  Administered 2011-10-22: 1 via ORAL
  Filled 2011-10-22: qty 1

## 2011-10-22 MED ORDER — VITAMIN B-1 100 MG PO TABS
100.0000 mg | ORAL_TABLET | Freq: Every day | ORAL | Status: DC
  Administered 2011-10-22: 100 mg via ORAL
  Filled 2011-10-22: qty 1

## 2011-10-22 MED ORDER — LORAZEPAM 1 MG PO TABS: 0.0000 mg | ORAL_TABLET | Freq: Two times a day (BID) | ORAL | Status: DC

## 2011-10-22 MED ORDER — ALUM & MAG HYDROXIDE-SIMETH 200-200-20 MG/5ML PO SUSP: 30.0000 mL | ORAL | Status: DC | PRN

## 2011-10-22 MED ORDER — FOLIC ACID 1 MG PO TABS
1.0000 mg | ORAL_TABLET | Freq: Every day | ORAL | Status: DC
  Administered 2011-10-22: 1 mg via ORAL
  Filled 2011-10-22: qty 1

## 2011-10-22 MED ORDER — LORAZEPAM 1 MG PO TABS: 1.0000 mg | ORAL_TABLET | Freq: Four times a day (QID) | ORAL | Status: DC | PRN

## 2011-10-22 MED ORDER — LORAZEPAM 1 MG PO TABS: 0.0000 mg | ORAL_TABLET | Freq: Four times a day (QID) | ORAL | Status: DC

## 2011-10-22 MED ORDER — ONDANSETRON HCL 4 MG PO TABS: 4.0000 mg | ORAL_TABLET | Freq: Three times a day (TID) | ORAL | Status: DC | PRN

## 2011-10-22 NOTE — Discharge Instructions (Signed)
Polysubstance Abuse When people abuse more than one drug or type of drug it is called polysubstance or polydrug abuse. For example, many smokers also drink alcohol. This is one form of polydrug abuse. Polydrug abuse also refers to the use of a drug to counteract an unpleasant effect produced by another drug. It may also be used to help with withdrawal from another drug. People who take stimulants may become agitated. Sometimes this agitation is countered with a tranquilizer. This helps protect against the unpleasant side effects. Polydrug abuse also refers to the use of different drugs at the same time.  Anytime drug use is interfering with normal living activities, it has become abuse. This includes problems with family and friends. Psychological dependence has developed when your mind tells you that the drug is needed. This is usually followed by physical dependence which has developed when continuing increases of drug are required to get the same feeling or "high". This is known as addiction or chemical dependency. A person's risk is much higher if there is a history of chemical dependency in the family. SIGNS OF CHEMICAL DEPENDENCY  You have been told by friends or family that drugs have become a problem.   You fight when using drugs.   You are having blackouts (not remembering what you do while using).   You feel sick from using drugs but continue using.   You lie about use or amounts of drugs (chemicals) used.   You need chemicals to get you going.   You are suffering in work performance or in school because of drug use.   You get sick from use of drugs but continue to use anyway.   You need drugs to relate to people or feel comfortable in social situations.   You use drugs to forget problems.  "Yes" answered to any of the above signs of chemical dependency indicates there are problems. The longer the use of drugs continues, the greater the problems will become. If there is a family  history of drug or alcohol use, it is best not to experiment with these drugs. Continual use leads to tolerance. After tolerance develops more of the drug is needed to get the same feeling. This is followed by addiction. With addiction, drugs become the most important part of life. It becomes more important to take drugs than participate in the other usual activities of life. This includes relating to friends and family. Addiction is followed by dependency. Dependency is a condition where drugs are now needed not just to get high, but to feel normal. Addiction cannot be cured but it can be stopped. This often requires outside help and the care of professionals. Treatment centers are listed in the yellow pages under: Cocaine, Narcotics, and Alcoholics Anonymous. Most hospitals and clinics can refer you to a specialized care center. Talk to your caregiver if you need help. Document Released: 12/08/2004 Document Revised: 04/07/2011 Document Reviewed: 04/18/2005 [facility] Patient Information 2012 [place], [place].  RESOURCE GUIDE  Chronic Pain Problems: Contact [facility]  [phone] Patients need to be referred by their primary care doctor.  Insufficient Money for Medicine: Contact United Way:  call "211" or [facility] [phone].  No Primary Care Doctor: - Call [facility] Connect  [phone] - can help you locate a primary care doctor that  accepts your insurance, provides certain services, etc. - Physician Referral [phone]  Agencies that provide inexpensive medical care: - [facility]  [phone] - [facility]  [phone] - [facility]  [phone] - Women's Clinic  [phone] - Planned Parenthood  [phone] [facility]  [phone]  Medicaid-accepting [facility] Providers: - [facility]- [address]  [phone], Mon-Fri 9am-7pm, Sat 9am-1pm - [facility]- [address] [place], [address] [place]  [phone] - [facility]- [address], [address] [place]  [phone] [facility]- [address]  [phone] - Renaye Rakers- [address] [place], [address], [phone]  Only accepts [place] patients after they have their name  applied to their card  Self Pay (no insurance) in [place]: - Sickle Cell Patients: Dr Willey Blade, [facility] Internal Medicine  [address] [place], [phone] - [facility]- [address] [place]  [phone]       [facility]- [address]       -     [facility]- see information above (Speak to [employer] if you do not have insurance)       -  [facility]- [address] [place], [phone]       -  [facility]- [address],  [phone]       -  [facility]- [address] [phone]       -  Dr Julio Sicks-  [address], [place], [phone]       -  [facility]- [address] [phone]       -  [facility]- [address] [phone], also [address] [phone]       -    [facility]- [address] [place], [phone], 1st & 3rd Saturday   every month, 10am-1pm  1) Find a Doctor and Pay Out of Pocket Although you won't have to find out who is covered by your insurance plan, it is a good idea to ask around and get recommendations. You will then need to call the office and see if the doctor you have chosen will accept you as a new patient and what types of options they offer for patients who are self-pay. Some doctors offer discounts or will set up payment plans for their patients who do not have insurance, but you will need to ask so you aren't surprised when you get to your appointment.  2) Contact Your Local Health Department Not all health departments have doctors that can see patients for sick visits, but many do, so  it is worth a call to see if yours does. If you don't know where your local health department is, you can check in your phone book. The CDC also has a tool to help you locate your state's health department, and many state websites also have listings of all of their local health departments.  3) Find a Walk-in Clinic If your illness is not likely to be very severe or complicated, you may want to try a walk in clinic. These are popping up all over the country in pharmacies, drugstores, and shopping centers. They're usually staffed by nurse practitioners or physician assistants that have been trained to treat common illnesses and complaints. They're usually fairly quick and inexpensive. However, if you have serious medical issues or chronic medical problems, these are probably not your best  option  STD Testing - [facility] Department of [facility], STD Clinic, [address], [place], phone [phone] or [phone].  Monday - Friday, call for an appointment. [facility] Department of [employer], STD Clinic, [address], [place], phone [phone] or [phone].  Monday - Friday, call for an appointment.  Abuse/Neglect: [facility] Child Abuse Hotline 629-082-4139 [facility] Child Abuse Hotline 234-521-6809 (After Hours)  Emergency Shelter:  [facility] 567-696-3945  Maternity Homes: - [facility] [place] [facility] (878) [phone] - [facility] [phone]  MRSA Hotline #:   205-618-9872  [facility] Resources  [facility] [place]  United [employer] [facility] Dept. [address]                 [address]         [address] [place] [address]  Blondell Reveal Phone:  [phone]                                  Phone:  [phone]                   Phone:  [phone]  [facility]  Mental Health, [phone] - [phone]       -     [facility] in [place], [address]                                  [phone], [facility] Child Abuse Hotline 270 015 3920 or 4253642371 (After Hours)   Behavioral Health Services  Substance Abuse Resources: - Alcohol and Drug Services  [phone] - [facility] [phone] - The [place] [phone] Floydene Flock [phone] - Residential & Outpatient Substance Abuse Program  [phone]  Psychological Services: [facility]  [phone] Services  [phone] - [facility], [phone] [place]. [address] [place], ACCESS LINE: 804-035-1623 or (514)[phone], [employer]an.[employer].za  Dental Assistance  If unable to pay or uninsured, contact:  [facility] or [facility]. to become qualified for the adult dental clinic.  Patients with Medicaid: [facility] [phone] [address], [phone], [phone]  If unable to pay, or uninsured, contact [facility] [phone]) or [facility] Department [phone] in [place], [phone] in [facility]) to become qualified for the adult dental clinic  Other Low-Cost Community Dental Services: - [facility]- [address] [place], [place], [place], [address], [phone], [address]. [address], 2nd and 4th Thursday of the month at 6:30am.  10 clients each day by appointment, can sometimes see walk-in patients if someone does not show for an appointment. [facility]- [address] [place], [place], [address], [phone] [facility]  Dental Clinic- [address], [place], [place], [address], [phone] - [facility]- [phone] [facility]- [phone] [facility] Department-  [phone]

## 2011-10-22 NOTE — ED Provider Notes (Signed)
Medical screening examination/treatment/procedure(s) were performed by non-physician practitioner and as supervising physician I was immediately available for consultation/collaboration.   Hanley Seamen, MD 10/22/11 [phone]

## 2011-10-22 NOTE — ED Notes (Signed)
Patient belongings are in locker #41

## 2011-10-22 NOTE — ED Notes (Signed)
ACT into see 

## 2011-10-22 NOTE — ED Provider Notes (Signed)
9:21 PM The patient was seen and evaluated by the psychiatrist Dr Jacky Kindle who believes the pt is not a threat to himself or others.  He believes he has antisocial behavior and that the patient is extremely manipulative    Lyanne Co, MD 10/22/11 2122

## 2011-10-22 NOTE — ED Notes (Signed)
SON contacted--telepsych pending and will be completed as soon as possible

## 2011-10-22 NOTE — ED Notes (Signed)
Up to the bathroom 

## 2011-10-22 NOTE — BH Assessment (Signed)
Assessment Note   [name] is an 51 y.o. male.   Presents with mild suicidal ideation with plan to overdose, "but I don't feel like that right now."  Pt reports he first attempted suicide 1 year ago by overdose.  Pt does not remember where he went to get help.  Pt denies HI.  Pt reports hearing voices "but that is not unusual, I hear voices, that is part of my history."  Pt receives medication mgt services from [facility] for Seroquel and Trazadone.  Pt also has a PCP -[facility].  Pt uses crack 3x per week, consumes 80 to 100 oz of beer per week and smoke THC 2x in last 2 days.  Pt reports "I binge use.  Sometimes, for a month and then I take a month off.  There is no reason why, just when I feel like it."  Pt does report going to AA periodically.  Pt made fair eye contact, flat affect, reported being depressed and not sleeping.  Pt lives alone and can return per pt.  Pt Ox4, and though pt reports being suicidal and hearing voices pt reports "I have been like this a long time.  I am just tired.  I want help, but I don't think I would kill myself, but I don't want to leave either."  Recommend Tele Psych to make recommendations.  Axis I: Schizoaffective Disorder and polysubst dep Axis II: Deferred Axis III:  Past Medical History  Diagnosis Date  . Hepatitis C   . Bipolar 1 disorder   . Schizophrenia, schizo-affective    Axis IV: other psychosocial or environmental problems, problems related to social environment and problems with primary support group Axis V: 31-40 impairment in reality testing  Past Medical History:  Past Medical History  Diagnosis Date  . Hepatitis C   . Bipolar 1 disorder   . Schizophrenia, schizo-affective     Past Surgical History  Procedure Date  . Appendectomy   . Shoulder surgery   . Cervical spine surgery     Family History:  Family History  Problem Relation Age of Onset  . Hypertension Mother   . Diabetes Father   .  Hypertension Brother     Social History:  reports that he has been smoking Cigarettes.  He does not have any smokeless tobacco history on file. He reports that he drinks alcohol. He reports that he uses illicit drugs (Cocaine and Marijuana).  Additional Social History:  Alcohol / Drug Use Pain Medications: no Prescriptions: yes - prescribed Over the Counter: na History of alcohol / drug use?: Yes Longest period of sobriety (when/how long): 6 mths Negative Consequences of Use: Personal relationships;Financial;Work / School Substance #1 Name of Substance 1: alcohol 1 - Age of First Use: 13 1 - Amount (size/oz): 100 oz 1 - Frequency: 3x per week 1 - Duration: 3 wks - binge drinker 1 - Last Use / Amount: 10-22-11 Substance #2 Name of Substance 2: THC 2 - Age of First Use: 14 2 - Amount (size/oz): 2 blunts 2 - Frequency: 2x in last 2 days 2 - Duration: 2days 2 - Last Use / Amount: 10-21-11 Substance #3 Name of Substance 3: crack 3 - Age of First Use: 34 3 - Amount (size/oz): $80 daily 3 - Frequency: 3x per week 3 - Duration: ongoing - 30 days 3 - Last Use / Amount: 10-22-11  CIWA: CIWA-Ar BP: 104/67 mmHg Pulse Rate: 58  Nausea and Vomiting: no nausea  and no vomiting Tactile Disturbances: none Tremor: no tremor Auditory Disturbances: not present Paroxysmal Sweats: no sweat visible Visual Disturbances: not present Anxiety: no anxiety, at ease Headache, Fullness in Head: none present Agitation: normal activity Orientation and Clouding of Sensorium: oriented and can do serial additions CIWA-Ar Total: 0  COWS:    Allergies: No Known Allergies  Home Medications:  (Not in a hospital admission)  OB/GYN Status:  No LMP for male patient.  General Assessment Data Location of Assessment: [facility] Living Arrangements: Parent Can pt return to current living arrangement?: Yes Admission Status: Voluntary Is patient capable of signing voluntary admission?: Yes Transfer from: Acute  Hospital Referral Source: MD  Education Status Is patient currently in school?: No  Risk to self Suicidal Ideation: Yes-Currently Present Suicidal Intent: No-Not Currently/Within Last 6 Months Is patient at risk for suicide?: Yes Suicidal Plan?: Yes-Currently Present Specify Current Suicidal Plan: overdose Access to Means: Yes Specify Access to Suicidal Means: has medications What has been your use of drugs/alcohol within the last 12 months?: yes Previous Attempts/Gestures: Yes How many times?: 1  Other Self Harm Risks: no Triggers for Past Attempts: Unpredictable Intentional Self Injurious Behavior: None Family Suicide History: No Recent stressful life event(s): Conflict (Comment);Other (Comment) (off meds 5 days) Persecutory voices/beliefs?: Yes Depression: Yes Depression Symptoms: Isolating;Tearfulness;Insomnia;Fatigue;Guilt;Loss of interest in usual pleasures;Feeling worthless/self pity;Feeling angry/irritable Substance abuse history and/or treatment for substance abuse?: No Suicide prevention information given to non-admitted patients: Not applicable  Risk to Others Homicidal Ideation: No Thoughts of Harm to Others: No Current Homicidal Intent: No Current Homicidal Plan: No Access to Homicidal Means: No Identified Victim: no History of harm to others?: No Assessment of Violence: None Noted Violent Behavior Description: none Does patient have access to weapons?: No Criminal Charges Pending?: No Does patient have a court date: No  Psychosis Hallucinations: Auditory Delusions: Unspecified  Mental Status Report Appear/Hygiene: Bizarre Eye Contact: Fair Motor Activity: Unremarkable Speech: Tangential;Logical/coherent Level of Consciousness: Alert Mood: Depressed;Anxious;Worthless, low self-esteem Affect: Anxious;Depressed;Blunted Anxiety Level: Minimal Thought Processes: Coherent Judgement: Impaired Orientation: Person;Place;Time;Situation Obsessive  Compulsive Thoughts/Behaviors: Minimal  Cognitive Functioning Concentration: Decreased Memory: Recent Intact;Remote Intact IQ: Average Insight: Poor Impulse Control: Poor Appetite: Fair Weight Loss: 0  Weight Gain: 0  Sleep: Decreased Total Hours of Sleep: 3  Vegetative Symptoms: None  ADLScreening [facility] Assessment Services) Patient's cognitive ability adequate to safely complete daily activities?: Yes Patient able to express need for assistance with ADLs?: Yes Independently performs ADLs?: Yes  Abuse/Neglect [facility]) Physical Abuse: Denies Verbal Abuse: Denies Sexual Abuse: Denies  Prior Inpatient Therapy Prior Inpatient Therapy: Yes Prior Therapy Dates: 2012 Prior Therapy Facilty/Provider(s): [facility] Reason for Treatment: SI  Prior Outpatient Therapy Prior Outpatient Therapy: Yes Prior Therapy Dates: current Prior Therapy Facilty/Provider(s): [facility] Reason for Treatment: Med mgt  ADL Screening (condition at time of admission) Patient's cognitive ability adequate to safely complete daily activities?: Yes Patient able to express need for assistance with ADLs?: Yes Independently performs ADLs?: Yes Weakness of Legs: None Weakness of Arms/Hands: None  Home Assistive Devices/Equipment Home Assistive Devices/Equipment: None  Therapy Consults (therapy consults require a physician order) PT Evaluation Needed: No OT Evalulation Needed: No SLP Evaluation Needed: No Abuse/Neglect Assessment (Assessment to be complete while patient is alone) Physical Abuse: Denies Verbal Abuse: Denies Sexual Abuse: Denies Exploitation of patient/patient's resources: Denies Self-Neglect: Denies Values / Beliefs Cultural Requests During Hospitalization: None Spiritual Requests During Hospitalization: None Consults Spiritual Care Consult Needed: No Social Work Consult Needed: No Merchant navy officer (For  Healthcare) Advance Directive: Patient does not have advance directive Pre-existing out  of facility DNR order (yellow form or pink MOST form): No Nutrition Screen Unintentional weight loss greater than 10lbs within the last month: No Problems chewing or swallowing foods and/or liquids: No Home Tube Feeding or Total Parenteral Nutrition (TPN): No Patient appears severely malnourished: No  Additional Information 1:1 In Past 12 Months?: No CIRT Risk: No Elopement Risk: No Does patient have medical clearance?: Yes     Disposition:   Tele Psych consult is recommended to determine if pt can be maintained at home with follow up with [facility].  Pt has mild plan to OD but pt reports he is not sure he wants to hurt self.  Pt reports starting back on medications may be most helpful.  Psychiatrist and MD to determine.  Disposition Disposition of Patient: Other dispositions (recommend Tele Psych; pt SI mild and maybe able to d/c) Other disposition(s): Other (Comment) (start back on meds if MD recommends)  On Site Evaluation by:   Reviewed with Physician:     Titus Mould, Eppie Gibson 10/22/2011 9:17 AM

## 2011-10-22 NOTE — ED Notes (Signed)
Pt is aware that telepsych consult will be completed today, procedure explained

## 2012-07-30 ENCOUNTER — Ambulatory Visit: Payer: Medicare Other

## 2013-12-13 ENCOUNTER — Emergency Department (HOSPITAL_COMMUNITY): Payer: Medicare Other

## 2013-12-13 ENCOUNTER — Encounter (HOSPITAL_COMMUNITY): Payer: Self-pay | Admitting: Emergency Medicine

## 2013-12-13 ENCOUNTER — Emergency Department (EMERGENCY_DEPARTMENT_HOSPITAL)
Admission: EM | Admit: 2013-12-13 | Discharge: 2013-12-14 | Disposition: A | Discharge status: Other Acute Inpatient Hospital | Payer: Medicare Other | Source: Home / Self Care | Attending: Emergency Medicine | Admitting: Emergency Medicine

## 2013-12-13 DIAGNOSIS — Z79899 Other long term (current) drug therapy: Secondary | ICD-10-CM

## 2013-12-13 DIAGNOSIS — F319 Bipolar disorder, unspecified: Secondary | ICD-10-CM

## 2013-12-13 DIAGNOSIS — R45851 Suicidal ideations: Secondary | ICD-10-CM

## 2013-12-13 DIAGNOSIS — F1994 Other psychoactive substance use, unspecified with psychoactive substance-induced mood disorder: Secondary | ICD-10-CM | POA: Insufficient documentation

## 2013-12-13 DIAGNOSIS — F39 Unspecified mood [affective] disorder: Secondary | ICD-10-CM

## 2013-12-13 DIAGNOSIS — F1010 Alcohol abuse, uncomplicated: Secondary | ICD-10-CM

## 2013-12-13 DIAGNOSIS — B1920 Unspecified viral hepatitis C without hepatic coma: Secondary | ICD-10-CM | POA: Diagnosis not present

## 2013-12-13 DIAGNOSIS — F259 Schizoaffective disorder, unspecified: Secondary | ICD-10-CM | POA: Insufficient documentation

## 2013-12-13 DIAGNOSIS — F329 Major depressive disorder, single episode, unspecified: Secondary | ICD-10-CM | POA: Insufficient documentation

## 2013-12-13 DIAGNOSIS — Z5900 Homelessness unspecified: Secondary | ICD-10-CM | POA: Insufficient documentation

## 2013-12-13 DIAGNOSIS — F4310 Post-traumatic stress disorder, unspecified: Secondary | ICD-10-CM

## 2013-12-13 DIAGNOSIS — Z8619 Personal history of other infectious and parasitic diseases: Secondary | ICD-10-CM | POA: Insufficient documentation

## 2013-12-13 DIAGNOSIS — F1410 Cocaine abuse, uncomplicated: Secondary | ICD-10-CM | POA: Insufficient documentation

## 2013-12-13 DIAGNOSIS — F17200 Nicotine dependence, unspecified, uncomplicated: Secondary | ICD-10-CM

## 2013-12-13 DIAGNOSIS — F1910 Other psychoactive substance abuse, uncomplicated: Secondary | ICD-10-CM

## 2013-12-13 HISTORY — DX: Post-traumatic stress disorder, unspecified: F43.10

## 2013-12-13 LAB — COMPREHENSIVE METABOLIC PANEL
ALT: 29 U/L (ref 0–53)
ANION GAP: 15 (ref 5–15)
AST: 35 U/L (ref 0–37)
Albumin: 4.4 g/dL (ref 3.5–5.2)
Alkaline Phosphatase: 69 U/L (ref 39–117)
BUN: 15 mg/dL (ref 6–23)
CALCIUM: 10 mg/dL (ref 8.4–10.5)
CO2: 23 mEq/L (ref 19–32)
Chloride: 102 mEq/L (ref 96–112)
Creatinine, Ser: 0.98 mg/dL (ref 0.50–1.35)
GFR calc non Af Amer: >90 mL/min (ref >90)
GLUCOSE: 107 mg/dL — AB (ref 70–99)
Potassium: 3.7 mEq/L (ref 3.7–5.3)
Sodium: 140 mEq/L (ref 137–147)
Total Bilirubin: 0.4 mg/dL (ref 0.3–1.2)
Total Protein: 7.7 g/dL (ref 6.0–8.3)

## 2013-12-13 LAB — CBC
HEMATOCRIT: 41.8 % (ref 39.0–52.0)
HEMOGLOBIN: 14.5 g/dL (ref 13.0–17.0)
MCH: 32 pg (ref 26.0–34.0)
MCHC: 34.7 g/dL (ref 30.0–36.0)
MCV: 92.3 fL (ref 78.0–100.0)
Platelets: 221 10*3/uL (ref 150–400)
RBC: 4.53 MIL/uL (ref 4.22–5.81)
RDW: 13.3 % (ref 11.5–15.5)
WBC: 9 10*3/uL (ref 4.0–10.5)

## 2013-12-13 LAB — RAPID URINE DRUG SCREEN, HOSP PERFORMED
AMPHETAMINES: NOT DETECTED
Barbiturates: NOT DETECTED
Benzodiazepines: NOT DETECTED
Cocaine: POSITIVE — AB
Opiates: NOT DETECTED
TETRAHYDROCANNABINOL: NOT DETECTED

## 2013-12-13 LAB — SALICYLATE LEVEL: Salicylate Lvl: <2 mg/dL — ABNORMAL LOW (ref 2.8–20.0)

## 2013-12-13 LAB — ACETAMINOPHEN LEVEL: Acetaminophen (Tylenol), Serum: <15 ug/mL (ref 10–30)

## 2013-12-13 LAB — ETHANOL: Alcohol, Ethyl (B): 79 mg/dL — ABNORMAL HIGH (ref 0–11)

## 2013-12-13 MED ORDER — TRAZODONE HCL 100 MG PO TABS: 100.0000 mg | ORAL_TABLET | Freq: Every day | ORAL | Status: DC

## 2013-12-13 MED ORDER — ZOLPIDEM TARTRATE 5 MG PO TABS: 5.0000 mg | ORAL_TABLET | Freq: Every evening | ORAL | Status: DC | PRN

## 2013-12-13 MED ORDER — QUETIAPINE FUMARATE 300 MG PO TABS: 500.0000 mg | ORAL_TABLET | Freq: Every day | ORAL | Status: DC

## 2013-12-13 MED ORDER — TRAZODONE HCL 100 MG PO TABS: 100.0000 mg | ORAL_TABLET | Freq: Every evening | ORAL | Status: DC | PRN

## 2013-12-13 MED ORDER — ONDANSETRON HCL 4 MG PO TABS: 4.0000 mg | ORAL_TABLET | Freq: Three times a day (TID) | ORAL | Status: DC | PRN

## 2013-12-13 MED ORDER — NICOTINE 21 MG/24HR TD PT24
21.0000 mg | MEDICATED_PATCH | Freq: Every day | TRANSDERMAL | Status: DC
  Administered 2013-12-14: 21 mg via TRANSDERMAL
  Filled 2013-12-13: qty 1

## 2013-12-13 MED ORDER — LORAZEPAM 1 MG PO TABS: 1.0000 mg | ORAL_TABLET | Freq: Three times a day (TID) | ORAL | Status: DC | PRN

## 2013-12-13 MED ORDER — TRAZODONE HCL 50 MG PO TABS: 50.0000 mg | ORAL_TABLET | Freq: Every evening | ORAL | Status: DC | PRN

## 2013-12-13 MED ORDER — IBUPROFEN 200 MG PO TABS
600.0000 mg | ORAL_TABLET | Freq: Three times a day (TID) | ORAL | Status: DC | PRN
  Administered 2013-12-14: 600 mg via ORAL
  Filled 2013-12-13: qty 3

## 2013-12-13 MED ORDER — TRAZODONE HCL 50 MG PO TABS: 150.0000 mg | ORAL_TABLET | Freq: Every day | ORAL | Status: DC

## 2013-12-13 MED ORDER — HYDROXYZINE HCL 25 MG PO TABS: 25.0000 mg | ORAL_TABLET | Freq: Four times a day (QID) | ORAL | Status: DC | PRN

## 2013-12-13 NOTE — ED Provider Notes (Signed)
CSN: [id number]     Arrival date & time 12/13/13  2048 History  This chart was scribed for non-physician practitioner working with Mirian MoMatthew Gentry, MD, by Roxy Cedarhandni Bhalodia ED Scribe. This patient was seen in room [facility]4/[facility] and the patient's care was started at 10:34 PM   Chief Complaint  Patient presents with  . Suicidal   The history is provided by the patient. No language interpreter was used.    HPI Comments: [name] is a 53 y.o. male with a history of bipolar depression and mood swings, who presents to the Emergency Department complaining of depression and thoughts of S/I.  Patient states he was released from jail 3 weeks ago and hasn't taken his Trazodone and Seroquel medications since then. Patient states he consumes alcohol and uses crack. Pt states he has had thoughts of S/I and "wanting to rob someone" and that these thoughts have increased during the past few days, but no action has been taken.  Patient [place] abnormal and he was told to follow up for this.  Denies any fever, or cough. No CP.  Past Medical History  Diagnosis Date  . Hepatitis C   . Bipolar 1 disorder   . Schizophrenia, schizo-affective   . PTSD (post-traumatic stress disorder)    Past Surgical History  Procedure Laterality Date  . Appendectomy    . Shoulder surgery    . Cervical spine surgery     Family History  Problem Relation Age of Onset  . Hypertension Mother   . Diabetes Father   . Hypertension Brother    History  Substance Use Topics  . Smoking status: Current Every Day Smoker    Types: Cigarettes  . Smokeless tobacco: Not on file  . Alcohol Use: Yes    Review of Systems  Constitutional: Negative for fever and chills.  Psychiatric/Behavioral:       Depression  All other systems reviewed and are negative.   Allergies  Review of patient's allergies indicates no known allergies.  Home Medications   Prior to  Admission medications   Medication Sig Start Date End Date Taking? Authorizing Provider  QUEtiapine (SEROQUEL) 100 MG tablet Take 500 mg by mouth at bedtime.    Yes Historical Provider, MD  traZODone (DESYREL) 150 MG tablet Take 150 mg by mouth at bedtime.   Yes Historical Provider, MD   Triage Vitals: BP 164/84  Pulse 96  Temp(Src) 98.6 F (37 C) (Oral)  Ht 6\' 1"  (1.854 m)  Wt 190 lb (86.183 kg)  BMI 25.07 kg/m2  SpO2 97% Physical Exam  Nursing note and vitals reviewed. Constitutional: He is oriented to person, place, and time. He appears well-developed and well-nourished.  HENT:  Head: Normocephalic.  Cardiovascular: Normal rate and regular rhythm.   Pulmonary/Chest: Effort normal and breath sounds normal. No respiratory distress. He has no wheezes. He has no rales.  Abdominal: Soft.  Neurological: He is alert and oriented to person, place, and time.  Skin: Skin is warm.  Psychiatric: He exhibits a depressed mood. He expresses suicidal ideation. He expresses no suicidal plans.    ED Course  Procedures   DIAGNOSTIC STUDIES: Oxygen Saturation is 97% on RA, normal by my interpretation.    COORDINATION OF CARE: 10:38 PM- Discussed plans to order diagnostic lab work. Pt advised of plan for treatment and pt agrees.  Psychiatric holding orders in place. TTS consult placed. The patient is medically  cleared and able for further psychiatric evaluation. In  Results for orders placed during the hospital encounter of 12/13/13  ACETAMINOPHEN LEVEL      Result Value Ref Range   Acetaminophen (Tylenol), Serum <15.0  10 - 30 ug/mL  CBC      Result Value Ref Range   WBC 9.0  4.0 - 10.5 K/uL   RBC 4.53  4.22 - 5.81 MIL/uL   Hemoglobin 14.5  13.0 - 17.0 g/dL   HCT 16.1  09.6 - 04.5 %   MCV 92.3  78.0 - 100.0 fL   MCH 32.0  26.0 - 34.0 pg   MCHC 34.7  30.0 - 36.0 g/dL   RDW 40.9  81.1 - 91.4 %   Platelets 221  150 - 400 K/uL  COMPREHENSIVE METABOLIC PANEL      Result Value Ref Range    Sodium 140  137 - 147 mEq/L   Potassium 3.7  3.7 - 5.3 mEq/L   Chloride 102  96 - 112 mEq/L   CO2 23  19 - 32 mEq/L   Glucose, Bld 107 (*) 70 - 99 mg/dL   BUN 15  6 - 23 mg/dL   Creatinine, Ser 7.82  0.50 - 1.35 mg/dL   Calcium 95.6  8.4 - 21.3 mg/dL   Total Protein 7.7  6.0 - 8.3 g/dL   Albumin 4.4  3.5 - 5.2 g/dL   AST 35  0 - 37 U/L   ALT 29  0 - 53 U/L   Alkaline Phosphatase 69  39 - 117 U/L   Total Bilirubin 0.4  0.3 - 1.2 mg/dL   GFR calc non Af Amer >90  >90 mL/min   GFR calc Af Amer >90  >90 mL/min   Anion gap 15  5 - 15  ETHANOL      Result Value Ref Range   Alcohol, Ethyl (B) 79 (*) 0 - 11 mg/dL  SALICYLATE LEVEL      Result Value Ref Range   Salicylate Lvl <2.0 (*) 2.8 - 20.0 mg/dL  URINE RAPID DRUG SCREEN (HOSP PERFORMED)      Result Value Ref Range   Opiates NONE DETECTED  NONE DETECTED   Cocaine POSITIVE (*) NONE DETECTED   Benzodiazepines NONE DETECTED  NONE DETECTED   Amphetamines NONE DETECTED  NONE DETECTED   Tetrahydrocannabinol NONE DETECTED  NONE DETECTED   Barbiturates NONE DETECTED  NONE DETECTED    Imaging Review Dg Chest 2 View  12/13/2013   CLINICAL DATA:  Left-sided chest pain.  Smoker.  EXAM: CHEST  2 VIEW  COMPARISON:  11/25/2005  FINDINGS: The heart size and mediastinal contours are within normal limits. Both lungs are clear. The visualized skeletal structures are unremarkable.  IMPRESSION: No active cardiopulmonary disease.   Electronically Signed   By: Charlett Nose M.D.   On: 12/13/2013 23:05     MDM   Final diagnoses:  Alcohol abuse  Cocaine abuse  Depression  Suicidal ideation   I personally performed the services described in this documentation, which was scribed in my presence. The recorded information has been reviewed and is accurate.        Angus Seller, PA-C 12/13/13 2322

## 2013-12-13 NOTE — ED Notes (Addendum)
Pt states he is homeless, has been drinking and doing drugs (crack), tonight is having thoughts of cutting his wrist and robbing someone.  Pt states he has not had his meds for Bipolar or PTSD x 3 weeks

## 2013-12-14 ENCOUNTER — Encounter (HOSPITAL_COMMUNITY): Payer: Self-pay | Admitting: Psychiatry

## 2013-12-14 ENCOUNTER — Observation Stay (HOSPITAL_COMMUNITY)
Admission: AD | Admit: 2013-12-14 | Discharge: 2013-12-16 | Disposition: A | Discharge status: Home / Self Care | Payer: Medicare Other | Source: Intra-hospital | Attending: Psychiatry | Admitting: Psychiatry

## 2013-12-14 DIAGNOSIS — F1010 Alcohol abuse, uncomplicated: Secondary | ICD-10-CM

## 2013-12-14 DIAGNOSIS — F39 Unspecified mood [affective] disorder: Secondary | ICD-10-CM | POA: Diagnosis present

## 2013-12-14 DIAGNOSIS — R45851 Suicidal ideations: Secondary | ICD-10-CM

## 2013-12-14 DIAGNOSIS — F1910 Other psychoactive substance abuse, uncomplicated: Secondary | ICD-10-CM

## 2013-12-14 MED ORDER — QUETIAPINE FUMARATE 400 MG PO TABS
500.0000 mg | ORAL_TABLET | Freq: Every day | ORAL | Status: DC
  Administered 2013-12-14 – 2013-12-15 (×2): 500 mg via ORAL
  Filled 2013-12-14: qty 1
  Filled 2013-12-14: qty 2
  Filled 2013-12-14 (×3): qty 1
  Filled 2013-12-14: qty 2

## 2013-12-14 MED ORDER — HYDROXYZINE HCL 25 MG PO TABS: 25.0000 mg | ORAL_TABLET | Freq: Four times a day (QID) | ORAL | Status: DC | PRN

## 2013-12-14 MED ORDER — ONDANSETRON HCL 4 MG PO TABS: 4.0000 mg | ORAL_TABLET | Freq: Three times a day (TID) | ORAL | Status: DC | PRN

## 2013-12-14 MED ORDER — NICOTINE 21 MG/24HR TD PT24
21.0000 mg | MEDICATED_PATCH | Freq: Every day | TRANSDERMAL | Status: DC
Start: 2013-12-15 — End: 2013-12-16
  Administered 2013-12-15 – 2013-12-16 (×2): 21 mg via TRANSDERMAL
  Filled 2013-12-14 (×4): qty 1

## 2013-12-14 MED ORDER — CITALOPRAM HYDROBROMIDE 20 MG PO TABS
20.0000 mg | ORAL_TABLET | Freq: Every day | ORAL | Status: DC
  Administered 2013-12-14: 20 mg via ORAL
  Filled 2013-12-14: qty 1

## 2013-12-14 MED ORDER — CITALOPRAM HYDROBROMIDE 20 MG PO TABS
20.0000 mg | ORAL_TABLET | Freq: Every day | ORAL | Status: DC
  Administered 2013-12-15 – 2013-12-16 (×2): 20 mg via ORAL
  Filled 2013-12-14 (×4): qty 1

## 2013-12-14 MED ORDER — TRAZODONE HCL 50 MG PO TABS
50.0000 mg | ORAL_TABLET | Freq: Every evening | ORAL | Status: DC | PRN
  Administered 2013-12-14: 50 mg via ORAL
  Filled 2013-12-14: qty 1

## 2013-12-14 MED ORDER — IBUPROFEN 600 MG PO TABS: 600.0000 mg | ORAL_TABLET | Freq: Three times a day (TID) | ORAL | Status: DC | PRN

## 2013-12-14 NOTE — ED Provider Notes (Signed)
Medical screening examination/treatment/procedure(s) were performed by non-physician practitioner and as supervising physician I was immediately available for consultation/collaboration.   EKG Interpretation None        Matthew Gentry, MD 12/14/13 1558 

## 2013-12-14 NOTE — Progress Notes (Signed)
[facility]  Oriented to observation unit.  Patient concurs previous assessment for depression and SI thoughts with no plan along with daily etoh use of 1 pint daily and $50 daily of cocaine. Is requesting assistance with medication restart and housing challenges.  Contracts for safety.  Support offered and 15' checks initiated.

## 2013-12-14 NOTE — BH Assessment (Signed)
Faxed referrals for [facility]  [place] [facility] [place]  Jacki Couse, [place] Triage Specialist 12/14/2013 1:10 AM

## 2013-12-14 NOTE — ED Notes (Signed)
[facility]. Accepted to the observation unit. Will notify [facility] to transport around 1500.

## 2013-12-14 NOTE — BH Assessment (Signed)
Per Gunnar FusiPaula [phone]) at [facility] could be accepted after 8 am 12/14/13 if he is placed under IVC. Discussed this with RN, who suggests discussing it with pt to determine if he is open to this option.   Pt reports he would prefer to remain voluntary at this time but would be willing to consider IVC later in the day if placement options are not available without IVC.    Left a [facility] for [facility] regarding [facility] remaining voluntary at this time.   Clista BernhardtNancy Olivianna Higley, [facility] Triage Specialist 12/14/2013 2:32 AM

## 2013-12-14 NOTE — BH Assessment (Signed)
Relayed results of assessment to [facility], FNP-BC. Per Renata Capriceonrad, pt meets inpt criteria but [facility]. TTS to seek placement. EDP, Kaleen MaskPeter Damme, PA-C is in agreement with this plan.   Clista BernhardtNancy Sugey Trevathan, [facility] Triage Specialist 12/14/2013 12:10 AM

## 2013-12-14 NOTE — ED Notes (Signed)
[facility]. Accepted to the observation unit. Belongings bag x1 given to driver. Ambulatory without difficulty. Denies pain. Denies SI, HI and AVH. No complaints voiced.
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
## 2013-12-14 NOTE — Progress Notes (Signed)
Patient appear cheerful and pleasant. Patient denies pain at this time. Denies AH/VH. Medication given as ordered. No new complaint. Every 15 minutes check. Will continue to monitor patient.

## 2013-12-15 DIAGNOSIS — F1010 Alcohol abuse, uncomplicated: Secondary | ICD-10-CM | POA: Diagnosis not present

## 2013-12-15 NOTE — Progress Notes (Signed)
Patient ID: Brent LeavensLavaughn C Chew, male   DOB: [birth year], 53 y.o.   MRN: [id number]   D: Pt has been very flat and depressed on the unit today, pt has also been very guarded. Pt did not engaged in conversation with nursing staff, however would respond to questions with a yes or no answer. Pt reported being positive SI, unable to contract for safety. Pt reported being negative HI, no AH/VH noted. Conrad NP made aware, orders noted that patient will remain in the observation unit pending inpatient bed. A: 15 min checks continued for patient safety. R: Pt safety maintained.

## 2013-12-15 NOTE — Progress Notes (Signed)
D: Patient presents a flat affect. Reports depression which he rated 5/10. Denies pain, SI, AH/VH. Patient received due medications as ordered. A: Offered support and encouragement to the patient. Encouraged patient to verbalize his needs to staff. Every 15 minutes check for safety. R: Patient very receptive. Will continue to monitor patient.

## 2013-12-15 NOTE — BH Assessment (Signed)
Pt[facility]:  [facility]  -[place]  Disposition [facility]
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
## 2013-12-16 DIAGNOSIS — F1010 Alcohol abuse, uncomplicated: Secondary | ICD-10-CM | POA: Diagnosis not present

## 2013-12-16 MED ORDER — QUETIAPINE FUMARATE 100 MG PO TABS: 500.0000 mg | ORAL_TABLET | Freq: Every day | ORAL | Status: DC

## 2013-12-16 MED ORDER — TRAZODONE HCL 50 MG PO TABS: 50.0000 mg | ORAL_TABLET | Freq: Every evening | ORAL | Status: DC | PRN

## 2013-12-16 MED ORDER — CITALOPRAM HYDROBROMIDE 20 MG PO TABS: 20.0000 mg | ORAL_TABLET | Freq: Every day | ORAL | Status: DC

## 2013-12-16 NOTE — Progress Notes (Signed)
Patient ID: Brent LeavensLavaughn C Davison, male   DOB: [birth year], 53 y.o.   MRN: [id number] D-On awakening talked about his plans after OBS. He would like to go to rehab for alcohol, and also uses crack. He is homeless and has been for about one year. He was in jail recently for 5 months and his disability check was cut off while in jail and he hasnt restarted it yet. He doesn't have anyone to be his payee. Made him aware that there are professional payees which he is unaware of. Denies any withdrawal sx at this time. Does report he feels depressed but is not suicidal at this time.  A-Support offered. Continue to monitor far safety. Medications as ordered. R- Waiting on NP to make recommendation of his next step and Tom H to assist with his disposition.
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
## 2013-12-16 NOTE — Discharge Instructions (Signed)
The following providers may be of help to you in maintaining a sober lifestyle.  If you are seeking residential treatment contact:       [facility]      [address]      [place], [place] [address]      [phone]  For outpatient treatment the following Chemical Dependency Intensive Outpatient Programs (CD-IOP) are available to you:       [facility]      [address]      [place], [place] [address]      [phone]      Contact person: Charmian MuffAnn Evans, LCAS       [facility]      [address]      [place], [place] [address]      [phone]       [facility]      [address]      [place], [place] [address]      [phone]  For emergency shelter needs contact one of the following:       [facility]      [address]      [place], [place] [address]      [phone]       [facility]      [address]      [place], [place] [address]      [phone]  To find a primary care doctor in your area, call the [facility] at [phone].

## 2013-12-16 NOTE — Progress Notes (Signed)
Patient ID: [name], male   DOB: 12-28-1960, 53 y.o.   MRN: [id number] Discharge Note-Tom H. Discharge coordinator tried to place him in a rehab program which was his want, but was unable to coordinate a bed for him today.He is disappointed but understanding. He doesn't want to return to a shelter, too noisy and unable to rest. He denies any thoughts currently to hurt self. He denies any thoughts to hurt others. He reports a history of psychosis but no A/H now, and last A/H he had was noise and his name being called, not commands.All of his property returned to him. Bus pass given to him by Elijah Birkom and information re follow up and reviewed his discharge plans with him. He verbalized his agreement.

## 2013-12-16 NOTE — Progress Notes (Signed)
Patient ID: [name], male   DOB: 1960/07/24, 53 y.o.   MRN: [id number] Gave him numbers able to find on internet for payee assist and he tried to call both numbers. The first number stated they had a freeze on new clients and he never got an answer after being on a very long hold. He states he will try to do it as a walk in to social services which is what the first number told him. They said they were on a freeze but that social services had a list of other representative payee providers. He does not want social services to be his payee because of a bad past experience with them. He is hopeful he will be accepted to a rehab place but is getting impatient with the wait. Continues to be pleasant and no behavior problem. His CIWA was 0 mid day. He has had no complaints.

## 2013-12-16 NOTE — Plan of Care (Addendum)
[facility] Observation Crisis Plan  Reason for Crisis Plan:  Substance Abuse   Plan of Care:  Attempt to place in a residential substance abuse rehabilitation program; failing this, refer to CD-IOP.  Family Support:    None; pt's sister reportedly died of an overdose  Current Living Environment:  Living Arrangements: Alone; Homeless  Insurance:  Medicare & [facility]   Name Acct ID Class Status Primary Coverage   Rhona LeavensHayes, Jassiah C [id number] BEHAVIORAL HEALTH OBSERVATION Open MEDICARE - MEDICARE PART A AND B        Guarantor Account (for Hospital Account [id number])   Name Relation to Pt Service Area Active? Acct Type   Rhona LeavensHayes, Sami C Self [facility] Yes Behavioral Health   Address Phone       homeless [place], [place] [address] [phone](H)          Coverage Information (for Hospital Account [id number])   1. MEDICARE/MEDICARE PART A AND B   F/O Payor/Plan Precert #   MEDICARE/MEDICARE PART A AND B    Subscriber Subscriber #   Rhona LeavensHayes, Narvel C [id number]   Address Phone   [address] [place], [place] [id number]        2. MEDICAID [place]/MEDICAID [place] ACCESS   F/O Payor/Plan Precert #   MEDICAID [place]/MEDICAID  ACCESS    Subscriber Subscriber #   Rhona LeavensHayes, Mahmood C [id number]   Address Phone   [id number] [place], [place] [address] [phone]          Legal Guardian:   Self; pt reportedly needs to have a payee for SSI benefits, but currently does not have one.  Primary Care Provider:  No PCP Per Patient  Current Outpatient Providers:  None currently  Psychiatrist:   None  Counselor/Therapist:   None  Compliant with Medications:  Yes  Additional Information: After consulting with Claudette Headonrad Withrow, NP it has been determined that psychiatric hospitalization is not indicated for this pt at this time.  Pt would benefit from placement in a residential substance abuse rehabilitation facility if it can be found in a timely fashion.  If  not, pt is to be referred to a Chemical Dependency Intensive Outpatient Program.  Calls were placed to [facility] and to [facility], neither of which have beds available today, and to [facility], which would only be able to admit pt for detoxification for which he does not currently meet clinical criteria.  I also called [facility] and left a voice message; as of this time reply is pending.  Discharge instructions will include the following for pt to pursue on his own: 1) Direct contact information for [facility] 2) Contact information for CD-IOP providers at [facility], [facility], and [facility] 3) Contact information for the [facility] to find a primary care provider 4) Contact information for [facility] and [facility] for homeless shelter support.  Doylene Canninghomas Makenzie Vittorio, MA Triage Specialist 12/16/2013 @ 16:40

## 2015-02-24 ENCOUNTER — Encounter (HOSPITAL_COMMUNITY): Payer: Self-pay | Admitting: Surgery

## 2015-02-24 ENCOUNTER — Emergency Department (HOSPITAL_COMMUNITY)
Admission: EM | Admit: 2015-02-24 | Discharge: 2015-02-25 | Disposition: A | Discharge status: Home / Self Care | Payer: Medicare Other | Attending: Emergency Medicine | Admitting: Emergency Medicine

## 2015-02-24 ENCOUNTER — Emergency Department (HOSPITAL_COMMUNITY): Payer: Medicare Other

## 2015-02-24 DIAGNOSIS — S21111A Laceration without foreign body of right front wall of thorax without penetration into thoracic cavity, initial encounter: Secondary | ICD-10-CM | POA: Diagnosis not present

## 2015-02-24 DIAGNOSIS — Y9289 Other specified places as the place of occurrence of the external cause: Secondary | ICD-10-CM | POA: Diagnosis not present

## 2015-02-24 DIAGNOSIS — S0990XA Unspecified injury of head, initial encounter: Secondary | ICD-10-CM | POA: Diagnosis not present

## 2015-02-24 DIAGNOSIS — Y9389 Activity, other specified: Secondary | ICD-10-CM | POA: Insufficient documentation

## 2015-02-24 DIAGNOSIS — Z720 Tobacco use: Secondary | ICD-10-CM | POA: Insufficient documentation

## 2015-02-24 DIAGNOSIS — Z8659 Personal history of other mental and behavioral disorders: Secondary | ICD-10-CM | POA: Diagnosis not present

## 2015-02-24 DIAGNOSIS — B1920 Unspecified viral hepatitis C without hepatic coma: Secondary | ICD-10-CM | POA: Insufficient documentation

## 2015-02-24 DIAGNOSIS — S21201A Unspecified open wound of right back wall of thorax without penetration into thoracic cavity, initial encounter: Secondary | ICD-10-CM | POA: Insufficient documentation

## 2015-02-24 DIAGNOSIS — S0083XA Contusion of other part of head, initial encounter: Secondary | ICD-10-CM | POA: Insufficient documentation

## 2015-02-24 DIAGNOSIS — Z23 Encounter for immunization: Secondary | ICD-10-CM | POA: Insufficient documentation

## 2015-02-24 DIAGNOSIS — Z8619 Personal history of other infectious and parasitic diseases: Secondary | ICD-10-CM | POA: Diagnosis not present

## 2015-02-24 DIAGNOSIS — S21119A Laceration without foreign body of unspecified front wall of thorax without penetration into thoracic cavity, initial encounter: Secondary | ICD-10-CM | POA: Diagnosis present

## 2015-02-24 DIAGNOSIS — S21431A Puncture wound without foreign body of right back wall of thorax with penetration into thoracic cavity, initial encounter: Secondary | ICD-10-CM

## 2015-02-24 DIAGNOSIS — Y998 Other external cause status: Secondary | ICD-10-CM | POA: Diagnosis not present

## 2015-02-24 DIAGNOSIS — F250 Schizoaffective disorder, bipolar type: Secondary | ICD-10-CM

## 2015-02-24 DIAGNOSIS — F259 Schizoaffective disorder, unspecified: Secondary | ICD-10-CM | POA: Insufficient documentation

## 2015-02-24 HISTORY — DX: Post-traumatic stress disorder, unspecified: F43.10

## 2015-02-24 HISTORY — DX: Unspecified viral hepatitis C without hepatic coma: B19.20

## 2015-02-24 LAB — CBC
HCT: 34.7 % — ABNORMAL LOW (ref 39.0–52.0)
Hemoglobin: 11.7 g/dL — ABNORMAL LOW (ref 13.0–17.0)
MCH: 31.6 pg (ref 26.0–34.0)
MCHC: 33.7 g/dL (ref 30.0–36.0)
MCV: 93.8 fL (ref 78.0–100.0)
PLATELETS: 145 10*3/uL — AB (ref 150–400)
RBC: 3.7 MIL/uL — ABNORMAL LOW (ref 4.22–5.81)
RDW: 12.3 % (ref 11.5–15.5)
WBC: 5.4 10*3/uL (ref 4.0–10.5)

## 2015-02-24 LAB — ABO/RH: ABO/RH(D): A POS

## 2015-02-24 MED ORDER — IOHEXOL 300 MG/ML  SOLN
100.0000 mL | Freq: Once | INTRAMUSCULAR | Status: AC | PRN
  Administered 2015-02-24: 100 mL via INTRAVENOUS
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
## 2015-02-25 ENCOUNTER — Encounter (HOSPITAL_COMMUNITY): Payer: Self-pay

## 2015-02-25 DIAGNOSIS — S21201A Unspecified open wound of right back wall of thorax without penetration into thoracic cavity, initial encounter: Secondary | ICD-10-CM | POA: Diagnosis not present

## 2015-02-25 LAB — COMPREHENSIVE METABOLIC PANEL
ALBUMIN: 3.5 g/dL (ref 3.5–5.0)
ALK PHOS: 45 U/L (ref 38–126)
ALT: 19 U/L (ref 17–63)
ANION GAP: 16 — AB (ref 5–15)
AST: 30 U/L (ref 15–41)
BUN: 11 mg/dL (ref 6–20)
CALCIUM: 8.9 mg/dL (ref 8.9–10.3)
CO2: 19 mmol/L — AB (ref 22–32)
Chloride: 101 mmol/L (ref 101–111)
Creatinine, Ser: 1.22 mg/dL (ref 0.61–1.24)
GFR calc Af Amer: >60 mL/min (ref >60)
GFR calc non Af Amer: >60 mL/min (ref >60)
GLUCOSE: 75 mg/dL (ref 65–99)
Potassium: 3.3 mmol/L — ABNORMAL LOW (ref 3.5–5.1)
SODIUM: 136 mmol/L (ref 135–145)
Total Bilirubin: 0.6 mg/dL (ref 0.3–1.2)
Total Protein: 5.7 g/dL — ABNORMAL LOW (ref 6.5–8.1)

## 2015-02-25 LAB — PREPARE FRESH FROZEN PLASMA
Unit division: 0
Unit division: 0

## 2015-02-25 LAB — TYPE AND SCREEN
ABO/RH(D): A POS
Antibody Screen: NEGATIVE
UNIT DIVISION: 0
UNIT DIVISION: 0

## 2015-02-25 LAB — PROTIME-INR
INR: 1.17 (ref 0.00–1.49)
Prothrombin Time: 15.1 seconds (ref 11.6–15.2)

## 2015-02-25 LAB — CDS SEROLOGY

## 2015-02-25 LAB — ETHANOL: Alcohol, Ethyl (B): 90 mg/dL — ABNORMAL HIGH (ref <5)

## 2015-02-25 MED ORDER — LIDOCAINE-EPINEPHRINE (PF) 2 %-1:200000 IJ SOLN: 20.0000 mL | Freq: Once | INTRAMUSCULAR | Status: DC

## 2015-02-25 MED ORDER — LIDOCAINE-EPINEPHRINE (PF) 2 %-1:200000 IJ SOLN
INTRAMUSCULAR | Status: AC
  Filled 2015-02-25: qty 20

## 2015-02-25 MED ORDER — SODIUM CHLORIDE 0.9 % IV BOLUS (SEPSIS)
1000.0000 mL | Freq: Once | INTRAVENOUS | Status: AC
  Administered 2015-02-24: 1000 mL via INTRAVENOUS

## 2015-02-25 MED ORDER — TETANUS-DIPHTH-ACELL PERTUSSIS 5-2.5-18.5 LF-MCG/0.5 IM SUSP
0.5000 mL | Freq: Once | INTRAMUSCULAR | Status: AC
  Administered 2015-02-25: 0.5 mL via INTRAMUSCULAR
  Filled 2015-02-25: qty 0.5
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
## 2015-03-14 ENCOUNTER — Encounter (HOSPITAL_COMMUNITY): Payer: Self-pay | Admitting: Psychiatry

## 2017-09-17 ENCOUNTER — Emergency Department (HOSPITAL_COMMUNITY)
Admission: EM | Admit: 2017-09-17 | Discharge: 2017-09-17 | Disposition: A | Discharge status: Home / Self Care | Payer: Medicare Other | Attending: Emergency Medicine | Admitting: Emergency Medicine

## 2017-09-17 ENCOUNTER — Encounter (HOSPITAL_COMMUNITY): Payer: Self-pay | Admitting: Nurse Practitioner

## 2017-09-17 DIAGNOSIS — S61412A Laceration without foreign body of left hand, initial encounter: Secondary | ICD-10-CM | POA: Diagnosis present

## 2017-09-17 DIAGNOSIS — Z5321 Procedure and treatment not carried out due to patient leaving prior to being seen by health care provider: Secondary | ICD-10-CM | POA: Diagnosis not present

## 2017-09-17 DIAGNOSIS — Y998 Other external cause status: Secondary | ICD-10-CM | POA: Diagnosis not present

## 2017-09-17 DIAGNOSIS — Y929 Unspecified place or not applicable: Secondary | ICD-10-CM | POA: Insufficient documentation

## 2017-09-17 DIAGNOSIS — Y939 Activity, unspecified: Secondary | ICD-10-CM | POA: Insufficient documentation

## 2017-09-17 DIAGNOSIS — W260XXA Contact with knife, initial encounter: Secondary | ICD-10-CM | POA: Insufficient documentation
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
## 2017-09-17 NOTE — ED Notes (Signed)
Cleansed pt's hands with soap, water and saline, dressed with dry gauze for wound care. On noticing the size of the wound, pt states he doesn't think it needs to be stiches, asked for pain medicine and stated he would like to leave. Advised to stay for further assessment as soon as a room at the back opens. Pt agitated but indicates he would stay.

## 2017-09-21 ENCOUNTER — Encounter (HOSPITAL_COMMUNITY)
Admission: EM | Disposition: A | Discharge status: Home / Self Care | Payer: Self-pay | Source: Home / Self Care | Attending: Family Medicine

## 2017-09-21 ENCOUNTER — Inpatient Hospital Stay (HOSPITAL_COMMUNITY)
Admission: EM | Admit: 2017-09-21 | Discharge: 2017-09-24 | DRG: 581 | Disposition: A | Discharge status: Home / Self Care | Payer: Medicare Other | Attending: Family Medicine | Admitting: Family Medicine

## 2017-09-21 ENCOUNTER — Observation Stay (HOSPITAL_COMMUNITY): Payer: Medicare Other | Admitting: Anesthesiology

## 2017-09-21 ENCOUNTER — Emergency Department (HOSPITAL_COMMUNITY): Payer: Medicare Other

## 2017-09-21 ENCOUNTER — Encounter (HOSPITAL_COMMUNITY): Payer: Self-pay

## 2017-09-21 DIAGNOSIS — W260XXA Contact with knife, initial encounter: Secondary | ICD-10-CM | POA: Diagnosis present

## 2017-09-21 DIAGNOSIS — L03012 Cellulitis of left finger: Secondary | ICD-10-CM | POA: Diagnosis present

## 2017-09-21 DIAGNOSIS — F1010 Alcohol abuse, uncomplicated: Secondary | ICD-10-CM | POA: Diagnosis present

## 2017-09-21 DIAGNOSIS — L03114 Cellulitis of left upper limb: Principal | ICD-10-CM | POA: Diagnosis present

## 2017-09-21 DIAGNOSIS — L03818 Cellulitis of other sites: Secondary | ICD-10-CM

## 2017-09-21 DIAGNOSIS — Z23 Encounter for immunization: Secondary | ICD-10-CM

## 2017-09-21 DIAGNOSIS — L02519 Cutaneous abscess of unspecified hand: Secondary | ICD-10-CM

## 2017-09-21 DIAGNOSIS — B9561 Methicillin susceptible Staphylococcus aureus infection as the cause of diseases classified elsewhere: Secondary | ICD-10-CM | POA: Diagnosis present

## 2017-09-21 DIAGNOSIS — F319 Bipolar disorder, unspecified: Secondary | ICD-10-CM | POA: Diagnosis present

## 2017-09-21 DIAGNOSIS — S61211A Laceration without foreign body of left index finger without damage to nail, initial encounter: Secondary | ICD-10-CM | POA: Diagnosis present

## 2017-09-21 DIAGNOSIS — F209 Schizophrenia, unspecified: Secondary | ICD-10-CM | POA: Diagnosis present

## 2017-09-21 DIAGNOSIS — T148XXA Other injury of unspecified body region, initial encounter: Secondary | ICD-10-CM

## 2017-09-21 DIAGNOSIS — F17210 Nicotine dependence, cigarettes, uncomplicated: Secondary | ICD-10-CM | POA: Diagnosis present

## 2017-09-21 DIAGNOSIS — F4310 Post-traumatic stress disorder, unspecified: Secondary | ICD-10-CM | POA: Diagnosis present

## 2017-09-21 DIAGNOSIS — Z8619 Personal history of other infectious and parasitic diseases: Secondary | ICD-10-CM

## 2017-09-21 DIAGNOSIS — S61412A Laceration without foreign body of left hand, initial encounter: Secondary | ICD-10-CM | POA: Diagnosis present

## 2017-09-21 DIAGNOSIS — L089 Local infection of the skin and subcutaneous tissue, unspecified: Secondary | ICD-10-CM | POA: Diagnosis not present

## 2017-09-21 DIAGNOSIS — Z590 Homelessness: Secondary | ICD-10-CM

## 2017-09-21 DIAGNOSIS — F1290 Cannabis use, unspecified, uncomplicated: Secondary | ICD-10-CM | POA: Diagnosis present

## 2017-09-21 DIAGNOSIS — L0390 Cellulitis, unspecified: Secondary | ICD-10-CM | POA: Diagnosis present

## 2017-09-21 HISTORY — PX: I&D EXTREMITY: SHX5045

## 2017-09-21 LAB — COMPREHENSIVE METABOLIC PANEL
ALBUMIN: 3.4 g/dL — AB (ref 3.5–5.0)
ALK PHOS: 57 U/L (ref 38–126)
ALT: 21 U/L (ref 17–63)
AST: 25 U/L (ref 15–41)
Anion gap: 9 (ref 5–15)
BILIRUBIN TOTAL: 0.9 mg/dL (ref 0.3–1.2)
BUN: 17 mg/dL (ref 6–20)
CALCIUM: 9 mg/dL (ref 8.9–10.3)
CO2: 25 mmol/L (ref 22–32)
Chloride: 100 mmol/L — ABNORMAL LOW (ref 101–111)
Creatinine, Ser: 1.05 mg/dL (ref 0.61–1.24)
GFR calc Af Amer: >60 mL/min (ref >60)
Glucose, Bld: 108 mg/dL — ABNORMAL HIGH (ref 65–99)
POTASSIUM: 3.5 mmol/L (ref 3.5–5.1)
Sodium: 134 mmol/L — ABNORMAL LOW (ref 135–145)
TOTAL PROTEIN: 6.1 g/dL — AB (ref 6.5–8.1)

## 2017-09-21 LAB — CBC WITH DIFFERENTIAL/PLATELET
Abs Immature Granulocytes: 0 10*3/uL (ref 0.0–0.1)
Basophils Absolute: 0 10*3/uL (ref 0.0–0.1)
Basophils Relative: 0 %
EOS PCT: 1 %
Eosinophils Absolute: 0.1 10*3/uL (ref 0.0–0.7)
HEMATOCRIT: 37.6 % — AB (ref 39.0–52.0)
HEMOGLOBIN: 12.9 g/dL — AB (ref 13.0–17.0)
Immature Granulocytes: 0 %
LYMPHS ABS: 0.5 10*3/uL — AB (ref 0.7–4.0)
LYMPHS PCT: 5 %
MCH: 32.6 pg (ref 26.0–34.0)
MCHC: 34.3 g/dL (ref 30.0–36.0)
MCV: 94.9 fL (ref 78.0–100.0)
MONOS PCT: 6 %
Monocytes Absolute: 0.6 10*3/uL (ref 0.1–1.0)
Neutro Abs: 9.3 10*3/uL — ABNORMAL HIGH (ref 1.7–7.7)
Neutrophils Relative %: 88 %
Platelets: 165 10*3/uL (ref 150–400)
RBC: 3.96 MIL/uL — ABNORMAL LOW (ref 4.22–5.81)
RDW: 11.4 % — AB (ref 11.5–15.5)
WBC: 10.5 10*3/uL (ref 4.0–10.5)

## 2017-09-21 SURGERY — IRRIGATION AND DEBRIDEMENT EXTREMITY
Anesthesia: General | Site: Hand | Laterality: Left

## 2017-09-21 MED ORDER — SODIUM CHLORIDE 0.9 % IR SOLN
Status: DC | PRN
  Administered 2017-09-21: 1000 mL

## 2017-09-21 MED ORDER — PHENYLEPHRINE HCL 10 MG/ML IJ SOLN
INTRAMUSCULAR | Status: DC | PRN
  Administered 2017-09-21: 40 ug via INTRAVENOUS

## 2017-09-21 MED ORDER — ONDANSETRON HCL 4 MG/2ML IJ SOLN
INTRAMUSCULAR | Status: AC
Start: 2017-09-21 — End: ?
  Filled 2017-09-21: qty 4

## 2017-09-21 MED ORDER — ONDANSETRON HCL 4 MG PO TABS: 4.0000 mg | ORAL_TABLET | Freq: Four times a day (QID) | ORAL | Status: DC | PRN

## 2017-09-21 MED ORDER — HYDROCODONE-ACETAMINOPHEN 5-325 MG PO TABS
2.0000 | ORAL_TABLET | Freq: Once | ORAL | Status: AC
  Administered 2017-09-21: 2 via ORAL
  Filled 2017-09-21: qty 2

## 2017-09-21 MED ORDER — LACTATED RINGERS IV SOLN
INTRAVENOUS | Status: DC | PRN
  Administered 2017-09-21 (×2): via INTRAVENOUS

## 2017-09-21 MED ORDER — CEFAZOLIN SODIUM-DEXTROSE 2-3 GM-%(50ML) IV SOLR
INTRAVENOUS | Status: DC | PRN
  Administered 2017-09-21 (×2): 2 g via INTRAVENOUS

## 2017-09-21 MED ORDER — MIDAZOLAM HCL 2 MG/2ML IJ SOLN
INTRAMUSCULAR | Status: AC
  Filled 2017-09-21: qty 2

## 2017-09-21 MED ORDER — PHENYLEPHRINE 40 MCG/ML (10ML) SYRINGE FOR IV PUSH (FOR BLOOD PRESSURE SUPPORT)
PREFILLED_SYRINGE | INTRAVENOUS | Status: AC
  Filled 2017-09-21: qty 50

## 2017-09-21 MED ORDER — DIPHENHYDRAMINE HCL 25 MG PO CAPS: 25.0000 mg | ORAL_CAPSULE | Freq: Four times a day (QID) | ORAL | Status: DC | PRN

## 2017-09-21 MED ORDER — HYDROCODONE-ACETAMINOPHEN 5-325 MG PO TABS
1.0000 | ORAL_TABLET | ORAL | Status: DC | PRN
  Administered 2017-09-22 – 2017-09-23 (×7): 2 via ORAL
  Filled 2017-09-21 (×7): qty 2

## 2017-09-21 MED ORDER — ADULT MULTIVITAMIN W/MINERALS CH
1.0000 | ORAL_TABLET | Freq: Every day | ORAL | Status: DC
  Administered 2017-09-21 – 2017-09-24 (×4): 1 via ORAL
  Filled 2017-09-21 (×4): qty 1

## 2017-09-21 MED ORDER — LIDOCAINE 2% (20 MG/ML) 5 ML SYRINGE
INTRAMUSCULAR | Status: AC
  Filled 2017-09-21: qty 15

## 2017-09-21 MED ORDER — ONDANSETRON HCL 4 MG/2ML IJ SOLN
INTRAMUSCULAR | Status: DC | PRN
  Administered 2017-09-21: 4 mg via INTRAVENOUS

## 2017-09-21 MED ORDER — 0.9 % SODIUM CHLORIDE (POUR BTL) OPTIME
TOPICAL | Status: DC | PRN
  Administered 2017-09-21: 1000 mL

## 2017-09-21 MED ORDER — BUPIVACAINE HCL (PF) 0.25 % IJ SOLN
INTRAMUSCULAR | Status: AC
  Filled 2017-09-21: qty 30

## 2017-09-21 MED ORDER — TETANUS-DIPHTH-ACELL PERTUSSIS 5-2.5-18.5 LF-MCG/0.5 IM SUSP
0.5000 mL | Freq: Once | INTRAMUSCULAR | Status: AC
  Administered 2017-09-21: 0.5 mL via INTRAMUSCULAR
  Filled 2017-09-21: qty 0.5

## 2017-09-21 MED ORDER — VITAMIN B-1 100 MG PO TABS
100.0000 mg | ORAL_TABLET | Freq: Every day | ORAL | Status: DC
  Administered 2017-09-21 – 2017-09-24 (×4): 100 mg via ORAL
  Filled 2017-09-21 (×4): qty 1

## 2017-09-21 MED ORDER — PROPOFOL 10 MG/ML IV BOLUS
INTRAVENOUS | Status: AC
  Filled 2017-09-21: qty 20

## 2017-09-21 MED ORDER — PROPOFOL 10 MG/ML IV BOLUS
INTRAVENOUS | Status: DC | PRN
  Administered 2017-09-21: 60 mg via INTRAVENOUS
  Administered 2017-09-21: 200 mg via INTRAVENOUS

## 2017-09-21 MED ORDER — CEFAZOLIN SODIUM-DEXTROSE 1-4 GM/50ML-% IV SOLN
1.0000 g | Freq: Once | INTRAVENOUS | Status: AC
  Administered 2017-09-21: 1 g via INTRAVENOUS
  Filled 2017-09-21: qty 50

## 2017-09-21 MED ORDER — ACETAMINOPHEN 650 MG RE SUPP: 650.0000 mg | Freq: Four times a day (QID) | RECTAL | Status: DC | PRN

## 2017-09-21 MED ORDER — METHOCARBAMOL 1000 MG/10ML IJ SOLN: 500.0000 mg | Freq: Four times a day (QID) | INTRAVENOUS | Status: DC | PRN

## 2017-09-21 MED ORDER — ONDANSETRON HCL 4 MG/2ML IJ SOLN: 4.0000 mg | Freq: Four times a day (QID) | INTRAMUSCULAR | Status: DC | PRN

## 2017-09-21 MED ORDER — SUCCINYLCHOLINE CHLORIDE 20 MG/ML IJ SOLN
INTRAMUSCULAR | Status: DC | PRN
  Administered 2017-09-21: 120 mg via INTRAVENOUS

## 2017-09-21 MED ORDER — ACETAMINOPHEN 325 MG PO TABS
650.0000 mg | ORAL_TABLET | Freq: Four times a day (QID) | ORAL | Status: DC | PRN
  Administered 2017-09-21: 650 mg via ORAL
  Filled 2017-09-21: qty 2

## 2017-09-21 MED ORDER — BUPIVACAINE HCL (PF) 0.25 % IJ SOLN
INTRAMUSCULAR | Status: DC | PRN
  Administered 2017-09-21: 12 mL

## 2017-09-21 MED ORDER — FENTANYL CITRATE (PF) 250 MCG/5ML IJ SOLN
INTRAMUSCULAR | Status: AC
  Filled 2017-09-21: qty 5

## 2017-09-21 MED ORDER — LACTATED RINGERS IV SOLN: INTRAVENOUS | Status: DC

## 2017-09-21 MED ORDER — CEFAZOLIN SODIUM-DEXTROSE 2-4 GM/100ML-% IV SOLN
2.0000 g | Freq: Three times a day (TID) | INTRAVENOUS | Status: DC
  Administered 2017-09-22 – 2017-09-23 (×4): 2 g via INTRAVENOUS
  Filled 2017-09-21 (×5): qty 100

## 2017-09-21 MED ORDER — VANCOMYCIN HCL 10 G IV SOLR
1500.0000 mg | Freq: Once | INTRAVENOUS | Status: AC
  Administered 2017-09-22: 1500 mg via INTRAVENOUS
  Filled 2017-09-21: qty 1500

## 2017-09-21 MED ORDER — FENTANYL CITRATE (PF) 100 MCG/2ML IJ SOLN: 25.0000 ug | INTRAMUSCULAR | Status: DC | PRN

## 2017-09-21 MED ORDER — MIDAZOLAM HCL 5 MG/5ML IJ SOLN
INTRAMUSCULAR | Status: DC | PRN
  Administered 2017-09-21: 2 mg via INTRAVENOUS

## 2017-09-21 MED ORDER — LORAZEPAM 1 MG PO TABS: 1.0000 mg | ORAL_TABLET | Freq: Four times a day (QID) | ORAL | Status: DC | PRN

## 2017-09-21 MED ORDER — THIAMINE HCL 100 MG/ML IJ SOLN: 100.0000 mg | Freq: Every day | INTRAMUSCULAR | Status: DC

## 2017-09-21 MED ORDER — LIDOCAINE HCL (CARDIAC) PF 100 MG/5ML IV SOSY
PREFILLED_SYRINGE | INTRAVENOUS | Status: DC | PRN
  Administered 2017-09-21: 60 mg via INTRATRACHEAL

## 2017-09-21 MED ORDER — LORAZEPAM 2 MG/ML IJ SOLN: 1.0000 mg | Freq: Four times a day (QID) | INTRAMUSCULAR | Status: DC | PRN

## 2017-09-21 MED ORDER — SODIUM CHLORIDE 0.9 % IV SOLN
1250.0000 mg | Freq: Two times a day (BID) | INTRAVENOUS | Status: DC
  Administered 2017-09-22 – 2017-09-23 (×2): 1250 mg via INTRAVENOUS
  Filled 2017-09-21 (×3): qty 1250

## 2017-09-21 MED ORDER — METHOCARBAMOL 500 MG PO TABS: 500.0000 mg | ORAL_TABLET | Freq: Four times a day (QID) | ORAL | Status: DC | PRN

## 2017-09-21 MED ORDER — FOLIC ACID 1 MG PO TABS
1.0000 mg | ORAL_TABLET | Freq: Every day | ORAL | Status: DC
  Administered 2017-09-21 – 2017-09-24 (×4): 1 mg via ORAL
  Filled 2017-09-21 (×4): qty 1

## 2017-09-21 MED ORDER — VITAMIN C 500 MG PO TABS
1000.0000 mg | ORAL_TABLET | Freq: Every day | ORAL | Status: DC
  Administered 2017-09-22 – 2017-09-24 (×3): 1000 mg via ORAL
  Filled 2017-09-21 (×3): qty 2

## 2017-09-21 MED ORDER — ONDANSETRON HCL 4 MG/2ML IJ SOLN: 4.0000 mg | Freq: Once | INTRAMUSCULAR | Status: DC | PRN

## 2017-09-21 MED ORDER — FENTANYL CITRATE (PF) 250 MCG/5ML IJ SOLN
INTRAMUSCULAR | Status: DC | PRN
  Administered 2017-09-21 (×3): 50 ug via INTRAVENOUS

## 2017-09-21 MED ORDER — DIPHENHYDRAMINE HCL 50 MG/ML IJ SOLN
INTRAMUSCULAR | Status: DC | PRN
  Administered 2017-09-21: 12.5 mg via INTRAVENOUS

## 2017-09-21 MED ORDER — LACTATED RINGERS IV SOLN
INTRAVENOUS | Status: DC
  Administered 2017-09-21 – 2017-09-22 (×2): via INTRAVENOUS

## 2017-09-21 MED ORDER — ENOXAPARIN SODIUM 40 MG/0.4ML ~~LOC~~ SOLN
40.0000 mg | Freq: Every day | SUBCUTANEOUS | Status: DC
  Filled 2017-09-21 (×2): qty 0.4

## 2017-09-21 SURGICAL SUPPLY — 51 items
BANDAGE ACE 3X5.8 VEL STRL LF (GAUZE/BANDAGES/DRESSINGS) ×3 IMPLANT
BANDAGE ACE 4X5 VEL STRL LF (GAUZE/BANDAGES/DRESSINGS) ×3 IMPLANT
BANDAGE COBAN STERILE 2 (GAUZE/BANDAGES/DRESSINGS) IMPLANT
BNDG ESMARK 4X9 LF (GAUZE/BANDAGES/DRESSINGS) IMPLANT
BNDG GAUZE ELAST 4 BULKY (GAUZE/BANDAGES/DRESSINGS) ×3 IMPLANT
CORDS BIPOLAR (ELECTRODE) ×3 IMPLANT
COVER SURGICAL LIGHT HANDLE (MISCELLANEOUS) ×3 IMPLANT
CUFF TOURNIQUET SINGLE 18IN (TOURNIQUET CUFF) IMPLANT
CUFF TOURNIQUET SINGLE 24IN (TOURNIQUET CUFF) IMPLANT
DECANTER SPIKE VIAL GLASS SM (MISCELLANEOUS) ×3 IMPLANT
DRAIN PENROSE 1/4X12 LTX STRL (WOUND CARE) IMPLANT
DRSG PAD ABDOMINAL 8X10 ST (GAUZE/BANDAGES/DRESSINGS) ×3 IMPLANT
GAUZE PACKING IODOFORM 1/4X15 (GAUZE/BANDAGES/DRESSINGS) ×3 IMPLANT
GAUZE SPONGE 4X4 12PLY STRL (GAUZE/BANDAGES/DRESSINGS) ×3 IMPLANT
GAUZE XEROFORM 1X8 LF (GAUZE/BANDAGES/DRESSINGS) ×3 IMPLANT
GLOVE BIO SURGEON STRL SZ7.5 (GLOVE) ×3 IMPLANT
GLOVE BIOGEL PI IND STRL 8 (GLOVE) ×1 IMPLANT
GLOVE BIOGEL PI INDICATOR 8 (GLOVE) ×2
GOWN STRL REUS W/ TWL LRG LVL3 (GOWN DISPOSABLE) ×1 IMPLANT
GOWN STRL REUS W/ TWL XL LVL3 (GOWN DISPOSABLE) ×1 IMPLANT
GOWN STRL REUS W/TWL LRG LVL3 (GOWN DISPOSABLE) ×2
GOWN STRL REUS W/TWL XL LVL3 (GOWN DISPOSABLE) ×2
KIT BASIN OR (CUSTOM PROCEDURE TRAY) ×3 IMPLANT
KIT TURNOVER KIT B (KITS) ×3 IMPLANT
LOOP VESSEL MAXI BLUE (MISCELLANEOUS) IMPLANT
MANIFOLD NEPTUNE II (INSTRUMENTS) IMPLANT
NEEDLE HYPO 25X1 1.5 SAFETY (NEEDLE) IMPLANT
NS IRRIG 1000ML POUR BTL (IV SOLUTION) ×3 IMPLANT
PACK ORTHO EXTREMITY (CUSTOM PROCEDURE TRAY) ×3 IMPLANT
PAD ARMBOARD 7.5X6 YLW CONV (MISCELLANEOUS) ×6 IMPLANT
PADDING CAST ABS 3INX4YD NS (CAST SUPPLIES) ×2
PADDING CAST ABS COTTON 3X4 (CAST SUPPLIES) ×1 IMPLANT
SCRUB BETADINE 4OZ XXX (MISCELLANEOUS) ×3 IMPLANT
SET CYSTO W/LG BORE CLAMP LF (SET/KITS/TRAYS/PACK) IMPLANT
SOL PREP POV-IOD 4OZ 10% (MISCELLANEOUS) ×3 IMPLANT
SPLINT PLASTER EXTRA FAST 3X15 (CAST SUPPLIES) ×2
SPLINT PLASTER GYPS XFAST 3X15 (CAST SUPPLIES) ×1 IMPLANT
SPONGE LAP 4X18 X RAY DECT (DISPOSABLE) ×3 IMPLANT
SUT ETHILON 4 0 P 3 18 (SUTURE) IMPLANT
SUT ETHILON 4 0 PS 2 18 (SUTURE) IMPLANT
SUT MON AB 5-0 P3 18 (SUTURE) IMPLANT
SWAB COLLECTION DEVICE MRSA (MISCELLANEOUS) ×3 IMPLANT
SWAB CULTURE ESWAB REG 1ML (MISCELLANEOUS) IMPLANT
SYR CONTROL 10ML LL (SYRINGE) IMPLANT
SYRINGE TOOMEY DISP (SYRINGE) ×3 IMPLANT
TOWEL OR 17X26 10 PK STRL BLUE (TOWEL DISPOSABLE) ×3 IMPLANT
TUBE CONNECTING 12'X1/4 (SUCTIONS) ×1
TUBE CONNECTING 12X1/4 (SUCTIONS) ×2 IMPLANT
TUBE FEEDING ENTERAL 5FR 16IN (TUBING) ×3 IMPLANT
UNDERPAD 30X30 (UNDERPADS AND DIAPERS) ×3 IMPLANT
YANKAUER SUCT BULB TIP NO VENT (SUCTIONS) ×3 IMPLANT

## 2017-09-21 NOTE — Discharge Instructions (Addendum)
Please call Ortho to confirm/make an appointment for follow up at [phone]    [facility] Instructions Hand Surgery  Wound Care: Keep your hand elevated above the level of your heart.  Do not allow it to dangle by your side.  Keep the dressing dry and do not remove it unless your doctor advises you to do so.  He will usually change it at the time of your post-op visit.  Moving your fingers is advised to stimulate circulation but will depend on the site of your surgery.  If you have a splint applied, your doctor will advise you regarding movement.  Activity: Do not drive or operate machinery today.  Rest today and then you may return to your normal activity and work as indicated by your physician.  Diet:  Drink liquids today or eat a light diet.  You may resume a regular diet tomorrow.    General expectations: Pain for two to three days. Fingers may become slightly swollen.  Call your doctor if any of the following occur: Severe pain not relieved by pain medication. Elevated temperature. Dressing soaked with blood. Inability to move fingers. White or bluish color to fingers.
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
## 2017-09-21 NOTE — Brief Op Note (Signed)
09/21/2017  10:01 PM  PATIENT:  [name]  57 y.o. male  PRE-OPERATIVE DIAGNOSIS:  Left Hand abscesses  POST-OPERATIVE DIAGNOSIS:  Left Hand abscesses  PROCEDURE:  Procedure(s): IRRIGATION AND DEBRIDEMENT HAND (Left)  SURGEON:  Surgeon(s) and Role:    Betha Loa, MD - Primary  PHYSICIAN ASSISTANT:   ASSISTANTS: none   ANESTHESIA:   general  EBL:  5 mL   BLOOD ADMINISTERED:none  DRAINS: feeding tube drain left index finger flexor sheath; iodoform packing  LOCAL MEDICATIONS USED:  MARCAINE     SPECIMEN:  Source of Specimen:  left index finger  DISPOSITION OF SPECIMEN:  micro  COUNTS:  YES  TOURNIQUET:   Total Tourniquet Time Documented: Upper Arm (Right) - 45 minutes Total: Upper Arm (Right) - 45 minutes   DICTATION: .Note written in EPIC  PLAN OF CARE: Admit to inpatient   PATIENT DISPOSITION:  PACU - hemodynamically stable.   Delay start of Pharmacological VTE agent (>24hrs) due to surgical blood loss or risk of bleeding: no
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
## 2017-09-21 NOTE — Anesthesia Procedure Notes (Signed)
Procedure Name: Intubation Date/Time: 09/21/2017 9:13 PM Performed by: Claudina Lick, CRNA Pre-anesthesia Checklist: Patient identified, Emergency Drugs available, Suction available, Patient being monitored and Timeout performed Patient Re-evaluated:Patient Re-evaluated prior to induction Oxygen Delivery Method: Circle system utilized Preoxygenation: Pre-oxygenation with 100% oxygen Induction Type: IV induction, Rapid sequence and Cricoid Pressure applied Laryngoscope Size: Miller and 2 Grade View: Grade I Tube type: Oral Tube size: 7.5 mm Number of attempts: 1 Airway Equipment and Method: Stylet Placement Confirmation: ETT inserted through vocal cords under direct vision,  positive ETCO2 and breath sounds checked- equal and bilateral Secured at: 22 cm Tube secured with: Tape Dental Injury: Teeth and Oropharynx as per pre-operative assessment

## 2017-09-21 NOTE — Progress Notes (Signed)
Status post incision and drainage left hand including flexor sheath of index finger, DIP joint of index finger, thenar eminence, dorsal thumb wound.  IV antibiotic's with transition to orals.  He is safe for discharge from a hand standpoint when he is afebrile with normalized or trending to normal white count.  Start hydrotherapy in 2 to 3 days.  This may be done as an outpatient in the office.

## 2017-09-21 NOTE — ED Notes (Signed)
Pt in radiology. Transport will return to room

## 2017-09-21 NOTE — Anesthesia Preprocedure Evaluation (Addendum)
Anesthesia Evaluation  Patient identified by MRN, date of birth, ID band Patient awake    Reviewed: Allergy & Precautions, NPO status , Patient's Chart, lab work & pertinent test results  Airway Mallampati: II  TM Distance: >3 FB Neck ROM: Full    Dental  (+) Dental Advisory Given   Pulmonary Current Smoker,    breath sounds clear to auscultation       Cardiovascular negative cardio ROS   Rhythm:Regular Rate:Normal     Neuro/Psych Anxiety Bipolar Disorder Schizophrenia negative neurological ROS     GI/Hepatic negative GI ROS, Neg liver ROS, (+) Hepatitis -, C  Endo/Other  negative endocrine ROS  Renal/GU negative Renal ROS     Musculoskeletal   Abdominal   Peds  Hematology negative hematology ROS (+)   Anesthesia Other Findings   Reproductive/Obstetrics                             Lab Results  Component Value Date   WBC 10.5 09/21/2017   HGB 12.9 (L) 09/21/2017   HCT 37.6 (L) 09/21/2017   MCV 94.9 09/21/2017   PLT 165 09/21/2017   Lab Results  Component Value Date   CREATININE 1.05 09/21/2017   BUN 17 09/21/2017   NA 134 (L) 09/21/2017   K 3.5 09/21/2017   CL 100 (L) 09/21/2017   CO2 25 09/21/2017    Anesthesia Physical Anesthesia Plan  ASA: II and emergent  Anesthesia Plan: General   Post-op Pain Management:    Induction: Intravenous and Rapid sequence  PONV Risk Score and Plan: 1 and Ondansetron, Dexamethasone and Treatment may vary due to age or medical condition  Airway Management Planned: Oral ETT  Additional Equipment:   Intra-op Plan:   Post-operative Plan: Extubation in OR  Informed Consent: I have reviewed the patients History and Physical, chart, labs and discussed the procedure including the risks, benefits and alternatives for the proposed anesthesia with the patient or authorized representative who has indicated his/her understanding and acceptance.    Dental advisory given  Plan Discussed with: CRNA, Anesthesiologist and Surgeon  Anesthesia Plan Comments:        Anesthesia Quick Evaluation
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
## 2017-09-21 NOTE — ED Notes (Signed)
Informed consent signed. Pt undressing into hospital gown and placing all belongings into belongings bag at this time.

## 2017-09-21 NOTE — Transfer of Care (Signed)
Immediate Anesthesia Transfer of Care Note  Patient: [name]  Procedure(s) Performed: IRRIGATION AND DEBRIDEMENT HAND (Left Hand)  Patient Location: PACU  Anesthesia Type:General  Level of Consciousness: drowsy  Airway & Oxygen Therapy: Patient Spontanous Breathing and Patient connected to face mask oxygen  Post-op Assessment: Report given to RN and Post -op Vital signs reviewed and stable  Post vital signs: Reviewed and stable  Last Vitals:  Vitals Value Taken Time  BP 112/65 09/21/2017 10:15 PM  Temp    Pulse 73 09/21/2017 10:20 PM  Resp 20 09/21/2017 10:20 PM  SpO2 100 % 09/21/2017 10:20 PM  Vitals shown include unvalidated device data.  Last Pain:  Vitals:   09/21/17 2215  TempSrc:   PainSc: (P) Asleep         Complications: No apparent anesthesia complications
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
## 2017-09-21 NOTE — Progress Notes (Signed)
Pharmacy Antibiotic Note  [name] is a 57 y.o. male admitted on 09/21/2017 with L hand infection s/p I & D.  Pharmacy has been consulted for Vancomycin and Cefazolin dosing.  Plan: Vancomycin 1500 mg IV now, then 1250 mg IV q12h Ancef 2 g IV q8h  Height:  (185.4 cm) Weight: 184 lb 15.5 oz (83.9 kg) IBW/kg (Calculated) : 79.9  Temp (24hrs), Avg:99.6 F (37.6 C), Min:98.1 F (36.7 C), Max:101.6 F (38.7 C)  Recent Labs  Lab 09/21/17 1534  WBC 10.5  CREATININE 1.05    Estimated Creatinine Clearance: 86.7 mL/min (by C-G formula based on SCr of 1.05 mg/dL).    No Known Allergies   Eddie Candle 09/21/2017 11:52 PM

## 2017-09-21 NOTE — Progress Notes (Signed)
Received pt from PACU. Alert and oriented x4. S/P I&D left hand, dsg noted with clean,dry and intact compression wrap. Denies c/o pain. Oriented to room and call bell.

## 2017-09-22 ENCOUNTER — Encounter (HOSPITAL_COMMUNITY): Payer: Self-pay | Admitting: Orthopedic Surgery

## 2017-09-22 DIAGNOSIS — S61211A Laceration without foreign body of left index finger without damage to nail, initial encounter: Secondary | ICD-10-CM | POA: Diagnosis present

## 2017-09-22 DIAGNOSIS — Z590 Homelessness: Secondary | ICD-10-CM | POA: Diagnosis not present

## 2017-09-22 DIAGNOSIS — L03012 Cellulitis of left finger: Secondary | ICD-10-CM | POA: Diagnosis present

## 2017-09-22 DIAGNOSIS — W260XXA Contact with knife, initial encounter: Secondary | ICD-10-CM | POA: Diagnosis not present

## 2017-09-22 DIAGNOSIS — Z23 Encounter for immunization: Secondary | ICD-10-CM | POA: Diagnosis present

## 2017-09-22 DIAGNOSIS — Z8619 Personal history of other infectious and parasitic diseases: Secondary | ICD-10-CM | POA: Diagnosis not present

## 2017-09-22 DIAGNOSIS — T148XXA Other injury of unspecified body region, initial encounter: Secondary | ICD-10-CM | POA: Diagnosis present

## 2017-09-22 DIAGNOSIS — S61412A Laceration without foreign body of left hand, initial encounter: Secondary | ICD-10-CM | POA: Diagnosis present

## 2017-09-22 DIAGNOSIS — L03818 Cellulitis of other sites: Secondary | ICD-10-CM | POA: Diagnosis not present

## 2017-09-22 DIAGNOSIS — F17210 Nicotine dependence, cigarettes, uncomplicated: Secondary | ICD-10-CM | POA: Diagnosis present

## 2017-09-22 DIAGNOSIS — F1010 Alcohol abuse, uncomplicated: Secondary | ICD-10-CM | POA: Diagnosis present

## 2017-09-22 DIAGNOSIS — B9561 Methicillin susceptible Staphylococcus aureus infection as the cause of diseases classified elsewhere: Secondary | ICD-10-CM | POA: Diagnosis present

## 2017-09-22 DIAGNOSIS — F319 Bipolar disorder, unspecified: Secondary | ICD-10-CM | POA: Diagnosis present

## 2017-09-22 DIAGNOSIS — F1290 Cannabis use, unspecified, uncomplicated: Secondary | ICD-10-CM | POA: Diagnosis present

## 2017-09-22 DIAGNOSIS — L02519 Cutaneous abscess of unspecified hand: Secondary | ICD-10-CM | POA: Diagnosis not present

## 2017-09-22 DIAGNOSIS — F4310 Post-traumatic stress disorder, unspecified: Secondary | ICD-10-CM | POA: Diagnosis present

## 2017-09-22 DIAGNOSIS — L03114 Cellulitis of left upper limb: Secondary | ICD-10-CM | POA: Diagnosis present

## 2017-09-22 DIAGNOSIS — F209 Schizophrenia, unspecified: Secondary | ICD-10-CM | POA: Diagnosis present

## 2017-09-22 LAB — CBC
HCT: 33.1 % — ABNORMAL LOW (ref 39.0–52.0)
HCT: 33.3 % — ABNORMAL LOW (ref 39.0–52.0)
Hemoglobin: 11.2 g/dL — ABNORMAL LOW (ref 13.0–17.0)
Hemoglobin: 11.3 g/dL — ABNORMAL LOW (ref 13.0–17.0)
MCH: 32.2 pg (ref 26.0–34.0)
MCH: 32.7 pg (ref 26.0–34.0)
MCHC: 33.8 g/dL (ref 30.0–36.0)
MCHC: 33.9 g/dL (ref 30.0–36.0)
MCV: 96.2 fL (ref 78.0–100.0)
MCV: 95.1 fL (ref 78.0–100.0)
PLATELETS: 133 10*3/uL — AB (ref 150–400)
PLATELETS: 133 10*3/uL — AB (ref 150–400)
RBC: 3.48 MIL/uL — ABNORMAL LOW (ref 4.22–5.81)
RBC: 3.46 MIL/uL — ABNORMAL LOW (ref 4.22–5.81)
RDW: 11.6 % (ref 11.5–15.5)
RDW: 11.4 % — AB (ref 11.5–15.5)
WBC: 7.5 10*3/uL (ref 4.0–10.5)
WBC: 7.4 10*3/uL (ref 4.0–10.5)

## 2017-09-22 LAB — CREATININE, SERUM
Creatinine, Ser: 0.91 mg/dL (ref 0.61–1.24)
GFR calc Af Amer: >60 mL/min (ref >60)
GFR calc non Af Amer: >60 mL/min (ref >60)

## 2017-09-22 LAB — HIV ANTIBODY (ROUTINE TESTING W REFLEX): HIV Screen 4th Generation wRfx: NONREACTIVE

## 2017-09-22 NOTE — Anesthesia Postprocedure Evaluation (Signed)
Anesthesia Post Note  Patient: [name]  Procedure(s) Performed: IRRIGATION AND DEBRIDEMENT HAND (Left Hand)     Patient location during evaluation: PACU Anesthesia Type: General Level of consciousness: awake and alert Pain management: pain level controlled Vital Signs Assessment: post-procedure vital signs reviewed and stable Respiratory status: spontaneous breathing, nonlabored ventilation, respiratory function stable and patient connected to nasal cannula oxygen Cardiovascular status: blood pressure returned to baseline and stable Postop Assessment: no apparent nausea or vomiting Anesthetic complications: no    Last Vitals:  Vitals:   09/21/17 2314 09/22/17 0550  BP: (!) 157/85 118/64  Pulse: 79 72  Resp: 15   Temp: 37.6 C 37.3 C  SpO2: 98% 97%    Last Pain:  Vitals:   09/22/17 0708  TempSrc:   PainSc: 0-No pain                 Kennieth Rad
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
## 2017-09-23 LAB — CBC
HCT: 33.4 % — ABNORMAL LOW (ref 39.0–52.0)
HEMOGLOBIN: 11.2 g/dL — AB (ref 13.0–17.0)
MCH: 32.1 pg (ref 26.0–34.0)
MCHC: 33.5 g/dL (ref 30.0–36.0)
MCV: 95.7 fL (ref 78.0–100.0)
Platelets: 146 10*3/uL — ABNORMAL LOW (ref 150–400)
RBC: 3.49 MIL/uL — AB (ref 4.22–5.81)
RDW: 11.4 % — ABNORMAL LOW (ref 11.5–15.5)
WBC: 6.5 10*3/uL (ref 4.0–10.5)

## 2017-09-23 MED ORDER — SULFAMETHOXAZOLE-TRIMETHOPRIM 800-160 MG PO TABS
1.0000 | ORAL_TABLET | Freq: Two times a day (BID) | ORAL | Status: DC
  Administered 2017-09-23 – 2017-09-24 (×3): 1 via ORAL
  Filled 2017-09-23 (×3): qty 1

## 2017-09-23 NOTE — Progress Notes (Signed)
Refused  Lovenox. MD oc call notified. Pt has been ambulating frequently.

## 2017-09-23 NOTE — Progress Notes (Signed)
Family Medicine Teaching Service Daily Progress Note Intern Pager: [phone]  Patient name: [name] Medical record number: [id number] Date of birth: 12-27-1960 Age: 57 y.o. Gender: male  Primary Care Provider: Patient, No Pcp Per Consultants: ortho hand Code Status: full  Pt Overview and Major Events to Date:  5/23 admitted to fpts, Left hand I&D 5/24 GPC on culture  Assessment and Plan: [name] is a 57 y.o. male presenting with swelling and redness in his L hand with fever. PMH is significant for schizophrenia, bipolar disorder, PTSD, h/o hep C, h/o alcohol abuse, and homelessness.  Left Hand infection Doing well. Mild but expected hand pain. Gram stain from surgical culture with GPC. Afebrile, wbc from 7.5 to 6.5. DC IV abx and switch to orals this am when sensitivities result. Will follow up with ortho hand next week for wound check. - D/C ctx and vanc, pharmacy to dose - switch to bactrim to complete a 7 day course - Norco PRN pain, tylenol prn  - Vitals per unit  - regular diet  Schizophrenia  Bipolar Disorder  PTSD Patient denies being on any medications at this time. Reports no suicidal or homicidal ideation. Previously on Celexa, Seroquel, and trazodone. - Discuss re-starting medications prior to discharge  Homelessness - follow up social work  H/o alcohol abuse Patient reports drinking a 40oz beer yesterday but not drinking every day. - CIWA  FEN/GI: regular diet PPx: lovenox  Disposition: likely home  Subjective:  Doing well this morning. Ready to get outside and "enjoy the fresh air". No complaints aside from mild pain in left hand.  Objective: Temp:  [98.6 F (37 C)-99.2 F (37.3 C)] 98.6 F (37 C) (05/25 0521) Pulse Rate:  [61-74] 64 (05/25 0521) Resp:  [18] 18 (05/25 0521) BP: (112-120)/(61-77) 112/77 (05/25 0521) SpO2:  [97 %-98 %] 98 % (05/25 0521) Physical Exam: General: No Acute Distress, pleasant middle aged caucasian male  resting in bed Cardiovascular: Regular Rate Rhythm, no murmurs/rubs/gallops, no LE edema Respiratory: CTAB, normal work of breathing Gastrointestinal: soft, nontender, nondistended, normoactive BS MSK: moves 4 extremities equally, Large dressing covering left arm. Derm: no rashes appreciated Neuro: CN II-XII grossly intact Psych: AO, appropriate affect  Laboratory: Recent Labs  Lab 09/21/17 1534 09/22/17 0252 09/23/17 0557  WBC 10.5 7.4  7.5 6.5  HGB 12.9* 11.3*  11.2* 11.2*  HCT 37.6* 33.3*  33.1* 33.4*  PLT 165 133*  133* 146*   Recent Labs  Lab 09/21/17 1534 09/22/17 0252  NA 134*  --   K 3.5  --   CL 100*  --   CO2 25  --   BUN 17  --   CREATININE 1.05 0.91  CALCIUM 9.0  --   PROT 6.1*  --   BILITOT 0.9  --   ALKPHOS 57  --   ALT 21  --   AST 25  --   GLUCOSE 108*  --    Imaging/Diagnostic Tests: CLINICAL DATA:  Left hand infection and lacerations.  EXAM: LEFT HAND - COMPLETE 3+ VIEW  COMPARISON:  None.  FINDINGS: No radiographically apparent laceration or radiopaque foreign body. Overall bone mineralization is normal. No bone destruction, joint dislocation or fracture. Carpal rows are maintained. Slight soft tissue swelling over the dorsum of the hand at the level of the MCP.  IMPRESSION: Soft tissue swelling of the dorsum of the hand at the level of the MCP. No acute osseous abnormality. No radiopaque foreign body is identified.  Electronically Signed   By: Tollie Eth M.D.   On: 09/21/2017 14:59  Myrene Buddy, MD 09/23/2017, 9:24 AM PGY-1, [facility] FPTS Intern pager: [phone], text pages welcome

## 2017-09-24 DIAGNOSIS — L02519 Cutaneous abscess of unspecified hand: Secondary | ICD-10-CM

## 2017-09-24 LAB — CBC
HEMATOCRIT: 36.3 % — AB (ref 39.0–52.0)
HEMOGLOBIN: 12.2 g/dL — AB (ref 13.0–17.0)
MCH: 31.9 pg (ref 26.0–34.0)
MCHC: 33.6 g/dL (ref 30.0–36.0)
MCV: 95 fL (ref 78.0–100.0)
Platelets: 176 10*3/uL (ref 150–400)
RBC: 3.82 MIL/uL — AB (ref 4.22–5.81)
RDW: 11.2 % — ABNORMAL LOW (ref 11.5–15.5)
WBC: 6.7 10*3/uL (ref 4.0–10.5)

## 2017-09-24 MED ORDER — HYDROCODONE-ACETAMINOPHEN 5-325 MG PO TABS: 1.0000 | ORAL_TABLET | ORAL | 0 refills | Status: DC | PRN

## 2017-09-24 MED ORDER — SULFAMETHOXAZOLE-TRIMETHOPRIM 800-160 MG PO TABS: 1.0000 | ORAL_TABLET | Freq: Two times a day (BID) | ORAL | 0 refills | Status: AC

## 2017-09-24 NOTE — Progress Notes (Signed)
[name] came to the nurses station asking for scissor. He unwrapped his dressing saying it needed to be changed. Assisted patient in reinforcing dressing. Small amount of bloody drainage and swelling. Will continue to monitor.

## 2017-09-24 NOTE — Progress Notes (Signed)
Pt wants to go home. Ambulating in the hall. Left hand splint with bulky dressing dry and intact. Discharge instructions given to pt. Discharged to home.

## 2017-09-24 NOTE — Progress Notes (Signed)
Family Medicine Teaching Service Daily Progress Note Intern Pager: [phone]  Patient name: [name] Medical record number: [id number] Date of birth: October 18, 1960 Age: 57 y.o. Gender: male  Primary Care Provider: Patient, No Pcp Per Consultants: ortho hand Code Status: full  Pt Overview and Major Events to Date:  5/23 admitted to fpts, Left hand I&D 5/24 GPC on culture  Assessment and Plan: [name] is a 57 y.o. male presenting with swelling and redness in his L hand with fever. PMH is significant for schizophrenia, bipolar disorder, PTSD, h/o hep C, h/o alcohol abuse, and homelessness.  Left Hand infection Doing well. Mild but expected hand pain. Gram stain from surgical culture with GPC. Afebrile, wbc from 7.5 to 6.5. DC IV abx and switch to orals this am when sensitivities result. Will follow up with ortho hand next week for wound check. - continue PO bactrim to complete a 7 day course - sensitivities of wound cx pending, would like to confirm sensitive to Bactrim prior to d/c - Norco PRN pain, tylenol prn  - Vitals per unit  - regular diet  Schizophrenia  Bipolar Disorder  PTSD Patient denies being on any medications at this time. Reports no suicidal or homicidal ideation. Previously on Celexa, Seroquel, and trazodone. - Discuss re-starting medications prior to discharge  Homelessness - social work has been consulted  H/o alcohol abuse Patient reports drinking a 40oz beer yesterday but not drinking every day. CIWA has been 0 - d/c CIWA  FEN/GI: regular diet PPx: lovenox  Disposition: home pending sensitivities likely 5/26  Subjective:  Hoping to go home today. Denies hand pain. No fever or chills.   Objective: Temp:  [98.2 F (36.8 C)-99.1 F (37.3 C)] 98.2 F (36.8 C) (05/26 0610) Pulse Rate:  [64-68] 64 (05/26 0610) Resp:  [15] 15 (05/26 0610) BP: (116-139)/(55-80) 116/55 (05/26 0610) SpO2:  [95 %-97 %] 97 % (05/26 0610) Physical  Exam:  Gen: pleasant 57 year old in NAD Heart: RRR no MRG Lungs: CTA bilaterally, no increased work of breathing Abdomen: soft, non-tender, non-distended, +BS Extremities: no edema or cyanosis, left hand bandaged Neuro: no focal deficits   Laboratory: Recent Labs  Lab 09/22/17 0252 09/23/17 0557 09/24/17 0400  WBC 7.4  7.5 6.5 6.7  HGB 11.3*  11.2* 11.2* 12.2*  HCT 33.3*  33.1* 33.4* 36.3*  PLT 133*  133* 146* 176   Recent Labs  Lab 09/21/17 1534 09/22/17 0252  NA 134*  --   K 3.5  --   CL 100*  --   CO2 25  --   BUN 17  --   CREATININE 1.05 0.91  CALCIUM 9.0  --   PROT 6.1*  --   BILITOT 0.9  --   ALKPHOS 57  --   ALT 21  --   AST 25  --   GLUCOSE 108*  --    Imaging/Diagnostic Tests: CLINICAL DATA:  Left hand infection and lacerations.  EXAM: LEFT HAND - COMPLETE 3+ VIEW  COMPARISON:  None.  FINDINGS: No radiographically apparent laceration or radiopaque foreign body. Overall bone mineralization is normal. No bone destruction, joint dislocation or fracture. Carpal rows are maintained. Slight soft tissue swelling over the dorsum of the hand at the level of the MCP.  IMPRESSION: Soft tissue swelling of the dorsum of the hand at the level of the MCP. No acute osseous abnormality. No radiopaque foreign body is identified.  Electronically Signed   By: Rene Kocher.D.  On: 09/21/2017 14:59  Tillman Sers, DO 09/24/2017, 8:34 AM PGY-2, [facility] FPTS Intern pager: [phone], text pages welcome

## 2017-09-26 LAB — CULTURE, BLOOD (ROUTINE X 2)
Culture: NO GROWTH
Culture: NO GROWTH
Special Requests: ADEQUATE

## 2017-09-26 NOTE — Discharge Summary (Signed)
Family Medicine Teaching [facility] Discharge Summary  Patient name: [name] Medical record number: [id number] Date of birth: [birth year] Age: 57 y.o. Gender: male Date of Admission: 09/21/2017  Date of Discharge: 09/24/2017 Admitting Physician: Moses Manners, MD  Primary Care Provider: Patient, No Pcp Per Consultants: Orthopedic Hand  Indication for Hospitalization: left hand cellulits  Discharge Diagnoses/Problem List:  Left hand cellulitis Homelessness History of alcohol abuse  Disposition: home  Discharge Condition: stable  Discharge Exam: General: No Acute Distress, pleasant middle aged caucasian male resting in bed Cardiovascular: Regular Rate Rhythm, no murmurs/rubs/gallops, no LE edema Respiratory: CTAB, normal work of breathing Gastrointestinal: soft, nontender, nondistended, normoactive BS MSK: moves 4 extremities equally, Large dressing covering left arm. Derm: no rashes appreciated Neuro: CN II-XII grossly intact Psych: AO, appropriate affect  Brief Hospital Course:  57 year old male who presented on 5/23 with erythema, swelling, and pain of left hand. Left hand xray consistent with cellulitis.   Left hand Cellulitis Taken to OR on 5/23 for left hand incision and drainage. Cultures were taken and he was started on vancomycin and ceftriaxone. POD#1 his pain medications were spaced out. On 5/25 cultures grew out as staph. He was switch to bactrim and observed overnight. Sensitivities resulted and was sensitive to bactrim. Patient was discharged with follow up with Orthopedic hand for a wound check.   Homelessness Patient with history of homelessness. Currently living in [place] with roommate. No concerns and did not want to talk to social work.  Issues for Follow Up:  1. Follow up with ortho hand for wound check 2. Make sure patient completed course of antibiotics  Significant Procedures: I&D of left hand on 5/23  Significant Labs and Imaging:   Recent Labs  Lab 09/22/17 0252 09/23/17 0557 09/24/17 0400  WBC 7.4  7.5 6.5 6.7  HGB 11.3*  11.2* 11.2* 12.2*  HCT 33.3*  33.1* 33.4* 36.3*  PLT 133*  133* 146* 176   Recent Labs  Lab 09/21/17 1534 09/22/17 0252  NA 134*  --   K 3.5  --   CL 100*  --   CO2 25  --   GLUCOSE 108*  --   BUN 17  --   CREATININE 1.05 0.91  CALCIUM 9.0  --   ALKPHOS 57  --   AST 25  --   ALT 21  --   ALBUMIN 3.4*  --     Results/Tests Pending at Time of Discharge:  Discharge Medications:  Allergies as of 09/24/2017   No Known Allergies     Medication List    TAKE these medications   citalopram 20 MG tablet Commonly known as:  CELEXA Take 1 tablet (20 mg total) by mouth daily.   HYDROcodone-acetaminophen 5-325 MG tablet Commonly known as:  NORCO/VICODIN Take 1 tablet by mouth every 4 (four) hours as needed for moderate pain.   ibuprofen 200 MG tablet Commonly known as:  ADVIL,MOTRIN Take 400-800 mg by mouth every 6 (six) hours as needed for mild pain.   QUEtiapine 100 MG tablet Commonly known as:  SEROQUEL Take 5 tablets (500 mg total) by mouth at bedtime.   sulfamethoxazole-trimethoprim 800-160 MG tablet Commonly known as:  BACTRIM DS,SEPTRA DS Take 1 tablet by mouth 2 (two) times daily for 5 days.   traZODone 50 MG tablet Commonly known as:  DESYREL Take 1 tablet (50 mg total) by mouth at bedtime as needed and may repeat dose one time if needed for sleep.  Discharge Instructions: Please refer to Patient Instructions section of EMR for full details.  Patient was counseled important signs and symptoms that should prompt return to medical care, changes in medications, dietary instructions, activity restrictions, and follow up appointments.   Follow-Up Appointments: Follow-up Information    Betha Loa, MD Follow up on 09/25/2017.   Specialty:  Orthopedic Surgery Contact information: [address] [place] [address] [phone]            Myrene Buddy, MD 09/26/2017, 2:22 PM PGY-1, [facility]

## 2017-09-27 LAB — AEROBIC/ANAEROBIC CULTURE W GRAM STAIN (SURGICAL/DEEP WOUND)

## 2017-09-27 LAB — AEROBIC/ANAEROBIC CULTURE (SURGICAL/DEEP WOUND)

## 2017-10-07 ENCOUNTER — Emergency Department (HOSPITAL_COMMUNITY)
Admission: EM | Admit: 2017-10-07 | Discharge: 2017-10-07 | Disposition: A | Discharge status: Home / Self Care | Payer: Medicare Other | Attending: Emergency Medicine | Admitting: Emergency Medicine

## 2017-10-07 ENCOUNTER — Encounter (HOSPITAL_COMMUNITY): Payer: Self-pay | Admitting: Emergency Medicine

## 2017-10-07 ENCOUNTER — Emergency Department (HOSPITAL_COMMUNITY): Payer: Medicare Other

## 2017-10-07 DIAGNOSIS — F17210 Nicotine dependence, cigarettes, uncomplicated: Secondary | ICD-10-CM | POA: Diagnosis not present

## 2017-10-07 DIAGNOSIS — L089 Local infection of the skin and subcutaneous tissue, unspecified: Secondary | ICD-10-CM | POA: Insufficient documentation

## 2017-10-07 DIAGNOSIS — Y33XXXA Other specified events, undetermined intent, initial encounter: Secondary | ICD-10-CM | POA: Diagnosis not present

## 2017-10-07 DIAGNOSIS — S61412D Laceration without foreign body of left hand, subsequent encounter: Secondary | ICD-10-CM | POA: Insufficient documentation

## 2017-10-07 LAB — BASIC METABOLIC PANEL
ANION GAP: 9 (ref 5–15)
BUN: 13 mg/dL (ref 6–20)
CALCIUM: 9.2 mg/dL (ref 8.9–10.3)
CO2: 26 mmol/L (ref 22–32)
Chloride: 108 mmol/L (ref 101–111)
Creatinine, Ser: 0.84 mg/dL (ref 0.61–1.24)
Glucose, Bld: 104 mg/dL — ABNORMAL HIGH (ref 65–99)
POTASSIUM: 4 mmol/L (ref 3.5–5.1)
Sodium: 143 mmol/L (ref 135–145)

## 2017-10-07 LAB — CBC
HEMATOCRIT: 39.7 % (ref 39.0–52.0)
Hemoglobin: 13.4 g/dL (ref 13.0–17.0)
MCH: 32.8 pg (ref 26.0–34.0)
MCHC: 33.8 g/dL (ref 30.0–36.0)
MCV: 97.3 fL (ref 78.0–100.0)
Platelets: 274 10*3/uL (ref 150–400)
RBC: 4.08 MIL/uL — ABNORMAL LOW (ref 4.22–5.81)
RDW: 12.4 % (ref 11.5–15.5)
WBC: 9.4 10*3/uL (ref 4.0–10.5)

## 2017-10-07 LAB — SEDIMENTATION RATE: Sed Rate: 9 mm/hr (ref 0–16)

## 2017-10-07 MED ORDER — AMPICILLIN-SULBACTAM SODIUM 3 (2-1) G IJ SOLR
3.0000 g | Freq: Once | INTRAMUSCULAR | Status: AC
  Administered 2017-10-07: 3 g via INTRAVENOUS
  Filled 2017-10-07: qty 3

## 2017-10-07 MED ORDER — AMOXICILLIN-POT CLAVULANATE 875-125 MG PO TABS: 1.0000 | ORAL_TABLET | Freq: Two times a day (BID) | ORAL | 0 refills | Status: DC

## 2017-10-07 MED ORDER — HYDROCODONE-ACETAMINOPHEN 5-325 MG PO TABS
1.0000 | ORAL_TABLET | Freq: Once | ORAL | Status: AC
  Administered 2017-10-07: 1 via ORAL
  Filled 2017-10-07: qty 1
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
## 2017-10-07 NOTE — Discharge Instructions (Signed)
Take antibiotics as prescribed. Take the entire course, even if your symptoms improve.  Soak your hand in soapy water 3 times a day for 20 minutes, massaging the back and front of your hand.  Go to the hand doctor's office at 9 AM on Monday. Do not eat Monday morning (or after midnight on Sunday).  Return to there ER if you develop high fevers, vomiting, or any new or concerning symptoms.

## 2017-10-09 ENCOUNTER — Encounter (HOSPITAL_BASED_OUTPATIENT_CLINIC_OR_DEPARTMENT_OTHER)
Admission: RE | Disposition: A | Discharge status: Home / Self Care | Payer: Self-pay | Source: Ambulatory Visit | Attending: Orthopedic Surgery

## 2017-10-09 ENCOUNTER — Encounter (HOSPITAL_BASED_OUTPATIENT_CLINIC_OR_DEPARTMENT_OTHER): Payer: Self-pay | Admitting: *Deleted

## 2017-10-09 ENCOUNTER — Ambulatory Visit (HOSPITAL_BASED_OUTPATIENT_CLINIC_OR_DEPARTMENT_OTHER)
Admission: RE | Admit: 2017-10-09 | Discharge: 2017-10-09 | Disposition: A | Discharge status: Home / Self Care | Payer: Medicare Other | Source: Ambulatory Visit | Attending: Orthopedic Surgery | Admitting: Orthopedic Surgery

## 2017-10-09 ENCOUNTER — Other Ambulatory Visit: Payer: Self-pay

## 2017-10-09 ENCOUNTER — Other Ambulatory Visit: Payer: Self-pay | Admitting: Orthopedic Surgery

## 2017-10-09 ENCOUNTER — Ambulatory Visit (HOSPITAL_BASED_OUTPATIENT_CLINIC_OR_DEPARTMENT_OTHER): Payer: Medicare Other | Admitting: Certified Registered"

## 2017-10-09 DIAGNOSIS — M65042 Abscess of tendon sheath, left hand: Secondary | ICD-10-CM | POA: Insufficient documentation

## 2017-10-09 DIAGNOSIS — F17210 Nicotine dependence, cigarettes, uncomplicated: Secondary | ICD-10-CM | POA: Insufficient documentation

## 2017-10-09 DIAGNOSIS — F4310 Post-traumatic stress disorder, unspecified: Secondary | ICD-10-CM | POA: Diagnosis not present

## 2017-10-09 DIAGNOSIS — M009 Pyogenic arthritis, unspecified: Secondary | ICD-10-CM | POA: Diagnosis not present

## 2017-10-09 DIAGNOSIS — B1920 Unspecified viral hepatitis C without hepatic coma: Secondary | ICD-10-CM | POA: Diagnosis not present

## 2017-10-09 DIAGNOSIS — L02512 Cutaneous abscess of left hand: Secondary | ICD-10-CM | POA: Insufficient documentation

## 2017-10-09 DIAGNOSIS — F319 Bipolar disorder, unspecified: Secondary | ICD-10-CM | POA: Insufficient documentation

## 2017-10-09 DIAGNOSIS — Z79899 Other long term (current) drug therapy: Secondary | ICD-10-CM | POA: Diagnosis not present

## 2017-10-09 HISTORY — PX: INCISION AND DRAINAGE: SHX5863

## 2017-10-09 SURGERY — INCISION AND DRAINAGE
Anesthesia: General | Site: Hand | Laterality: Left

## 2017-10-09 MED ORDER — MIDAZOLAM HCL 2 MG/2ML IJ SOLN: 1.0000 mg | INTRAMUSCULAR | Status: DC | PRN

## 2017-10-09 MED ORDER — DEXAMETHASONE SODIUM PHOSPHATE 10 MG/ML IJ SOLN
INTRAMUSCULAR | Status: AC
  Filled 2017-10-09: qty 1

## 2017-10-09 MED ORDER — LACTATED RINGERS IV SOLN: INTRAVENOUS | Status: DC

## 2017-10-09 MED ORDER — FENTANYL CITRATE (PF) 100 MCG/2ML IJ SOLN
INTRAMUSCULAR | Status: DC | PRN
  Administered 2017-10-09: 50 ug via INTRAVENOUS
  Administered 2017-10-09 (×2): 25 ug via INTRAVENOUS

## 2017-10-09 MED ORDER — 0.9 % SODIUM CHLORIDE (POUR BTL) OPTIME
TOPICAL | Status: DC | PRN
  Administered 2017-10-09: 500 mL

## 2017-10-09 MED ORDER — BUPIVACAINE HCL (PF) 0.25 % IJ SOLN
INTRAMUSCULAR | Status: DC | PRN
  Administered 2017-10-09: 8.5 mL

## 2017-10-09 MED ORDER — FENTANYL CITRATE (PF) 100 MCG/2ML IJ SOLN: 50.0000 ug | INTRAMUSCULAR | Status: DC | PRN

## 2017-10-09 MED ORDER — HYDROMORPHONE HCL 1 MG/ML IJ SOLN
0.2500 mg | INTRAMUSCULAR | Status: DC | PRN
Start: 2017-10-09 — End: 2017-10-09
  Administered 2017-10-09: 0.5 mg via INTRAVENOUS

## 2017-10-09 MED ORDER — MIDAZOLAM HCL 2 MG/2ML IJ SOLN
INTRAMUSCULAR | Status: AC
  Filled 2017-10-09: qty 2

## 2017-10-09 MED ORDER — MEPERIDINE HCL 25 MG/ML IJ SOLN
6.2500 mg | INTRAMUSCULAR | Status: DC | PRN
Start: 2017-10-09 — End: 2017-10-09

## 2017-10-09 MED ORDER — PROPOFOL 10 MG/ML IV BOLUS
INTRAVENOUS | Status: DC | PRN
  Administered 2017-10-09: 200 mg via INTRAVENOUS

## 2017-10-09 MED ORDER — SCOPOLAMINE 1 MG/3DAYS TD PT72: 1.0000 | MEDICATED_PATCH | Freq: Once | TRANSDERMAL | Status: DC | PRN

## 2017-10-09 MED ORDER — HYDROMORPHONE HCL 1 MG/ML IJ SOLN
INTRAMUSCULAR | Status: AC
  Filled 2017-10-09: qty 0.5

## 2017-10-09 MED ORDER — LACTATED RINGERS IV SOLN
INTRAVENOUS | Status: DC
  Administered 2017-10-09: 12:00:00 via INTRAVENOUS

## 2017-10-09 MED ORDER — PROMETHAZINE HCL 25 MG/ML IJ SOLN: 6.2500 mg | INTRAMUSCULAR | Status: DC | PRN

## 2017-10-09 MED ORDER — OXYCODONE HCL 5 MG/5ML PO SOLN: 5.0000 mg | Freq: Once | ORAL | Status: DC | PRN

## 2017-10-09 MED ORDER — LIDOCAINE HCL (CARDIAC) PF 100 MG/5ML IV SOSY
PREFILLED_SYRINGE | INTRAVENOUS | Status: AC
  Filled 2017-10-09: qty 5

## 2017-10-09 MED ORDER — MIDAZOLAM HCL 5 MG/5ML IJ SOLN
INTRAMUSCULAR | Status: DC | PRN
  Administered 2017-10-09: 2 mg via INTRAVENOUS

## 2017-10-09 MED ORDER — FENTANYL CITRATE (PF) 100 MCG/2ML IJ SOLN
INTRAMUSCULAR | Status: AC
  Filled 2017-10-09: qty 2

## 2017-10-09 MED ORDER — ONDANSETRON HCL 4 MG/2ML IJ SOLN
INTRAMUSCULAR | Status: DC | PRN
  Administered 2017-10-09: 4 mg via INTRAVENOUS

## 2017-10-09 MED ORDER — BUPIVACAINE HCL (PF) 0.25 % IJ SOLN
INTRAMUSCULAR | Status: AC
  Filled 2017-10-09: qty 30

## 2017-10-09 MED ORDER — HYDROCODONE-ACETAMINOPHEN 5-325 MG PO TABS: ORAL_TABLET | ORAL | 0 refills | Status: DC

## 2017-10-09 MED ORDER — CEFAZOLIN SODIUM-DEXTROSE 2-3 GM-%(50ML) IV SOLR
INTRAVENOUS | Status: DC | PRN
  Administered 2017-10-09: 2 g via INTRAVENOUS

## 2017-10-09 MED ORDER — OXYCODONE HCL 5 MG PO TABS: 5.0000 mg | ORAL_TABLET | Freq: Once | ORAL | Status: DC | PRN

## 2017-10-09 MED ORDER — SULFAMETHOXAZOLE-TRIMETHOPRIM 800-160 MG PO TABS: 1.0000 | ORAL_TABLET | Freq: Two times a day (BID) | ORAL | 0 refills | Status: DC

## 2017-10-09 MED ORDER — DEXAMETHASONE SODIUM PHOSPHATE 10 MG/ML IJ SOLN
INTRAMUSCULAR | Status: DC | PRN
  Administered 2017-10-09: 10 mg via INTRAVENOUS

## 2017-10-09 MED ORDER — CEFAZOLIN SODIUM 1 G IJ SOLR
INTRAMUSCULAR | Status: AC
  Filled 2017-10-09: qty 20

## 2017-10-09 MED ORDER — LIDOCAINE 2% (20 MG/ML) 5 ML SYRINGE
INTRAMUSCULAR | Status: DC | PRN
  Administered 2017-10-09: 60 mg via INTRAVENOUS

## 2017-10-09 MED ORDER — ONDANSETRON HCL 4 MG/2ML IJ SOLN
INTRAMUSCULAR | Status: AC
Start: 2017-10-09 — End: ?
  Filled 2017-10-09: qty 2

## 2017-10-09 SURGICAL SUPPLY — 51 items
BAG DECANTER FOR FLEXI CONT (MISCELLANEOUS) IMPLANT
BANDAGE ACE 3X5.8 VEL STRL LF (GAUZE/BANDAGES/DRESSINGS) ×3 IMPLANT
BLADE MINI RND TIP GREEN BEAV (BLADE) IMPLANT
BLADE SURG 15 STRL LF DISP TIS (BLADE) ×2 IMPLANT
BLADE SURG 15 STRL SS (BLADE) ×4
BNDG COHESIVE 1X5 TAN STRL LF (GAUZE/BANDAGES/DRESSINGS) IMPLANT
BNDG ELASTIC 2X5.8 VLCR STR LF (GAUZE/BANDAGES/DRESSINGS) IMPLANT
BNDG ESMARK 4X9 LF (GAUZE/BANDAGES/DRESSINGS) ×3 IMPLANT
BNDG GAUZE 1X2.1 STRL (MISCELLANEOUS) IMPLANT
BNDG GAUZE ELAST 4 BULKY (GAUZE/BANDAGES/DRESSINGS) ×3 IMPLANT
BRUSH SCRUB EZ PLAIN DRY (MISCELLANEOUS) ×3 IMPLANT
CHLORAPREP W/TINT 26ML (MISCELLANEOUS) IMPLANT
CORD BIPOLAR FORCEPS 12FT (ELECTRODE) ×3 IMPLANT
COVER BACK TABLE 60X90IN (DRAPES) ×3 IMPLANT
COVER MAYO STAND STRL (DRAPES) ×3 IMPLANT
CUFF TOURNIQUET SINGLE 18IN (TOURNIQUET CUFF) ×3 IMPLANT
DRAPE EXTREMITY T 121X128X90 (DRAPE) ×3 IMPLANT
DRAPE SURG 17X23 STRL (DRAPES) ×3 IMPLANT
GAUZE PACKING IODOFORM 1/4X15 (GAUZE/BANDAGES/DRESSINGS) ×3 IMPLANT
GAUZE SPONGE 4X4 12PLY STRL (GAUZE/BANDAGES/DRESSINGS) ×3 IMPLANT
GAUZE XEROFORM 1X8 LF (GAUZE/BANDAGES/DRESSINGS) ×3 IMPLANT
GLOVE BIO SURGEON STRL SZ7.5 (GLOVE) ×3 IMPLANT
GLOVE BIOGEL PI IND STRL 7.0 (GLOVE) ×3 IMPLANT
GLOVE BIOGEL PI IND STRL 8 (GLOVE) ×1 IMPLANT
GLOVE BIOGEL PI INDICATOR 7.0 (GLOVE) ×6
GLOVE BIOGEL PI INDICATOR 8 (GLOVE) ×2
GLOVE SURG SS PI 7.0 STRL IVOR (GLOVE) ×3 IMPLANT
GOWN STRL REUS W/ TWL LRG LVL3 (GOWN DISPOSABLE) ×1 IMPLANT
GOWN STRL REUS W/TWL LRG LVL3 (GOWN DISPOSABLE) ×2
GOWN STRL REUS W/TWL XL LVL3 (GOWN DISPOSABLE) ×3 IMPLANT
LOOP VESSEL MAXI BLUE (MISCELLANEOUS) IMPLANT
NEEDLE HYPO 25X1 1.5 SAFETY (NEEDLE) ×3 IMPLANT
NS IRRIG 1000ML POUR BTL (IV SOLUTION) ×3 IMPLANT
PACK BASIN DAY SURGERY FS (CUSTOM PROCEDURE TRAY) ×3 IMPLANT
PAD CAST 3X4 CTTN HI CHSV (CAST SUPPLIES) IMPLANT
PADDING CAST ABS 4INX4YD NS (CAST SUPPLIES) ×2
PADDING CAST ABS COTTON 4X4 ST (CAST SUPPLIES) ×1 IMPLANT
PADDING CAST COTTON 3X4 STRL (CAST SUPPLIES)
SPLINT PLASTER CAST XFAST 3X15 (CAST SUPPLIES) ×10 IMPLANT
SPLINT PLASTER XTRA FASTSET 3X (CAST SUPPLIES) ×20
STOCKINETTE 4X48 STRL (DRAPES) ×3 IMPLANT
SUT ETHILON 4 0 PS 2 18 (SUTURE) ×3 IMPLANT
SWAB COLLECTION DEVICE MRSA (MISCELLANEOUS) ×3 IMPLANT
SWAB CULTURE ESWAB REG 1ML (MISCELLANEOUS) ×3 IMPLANT
SYR 20CC LL (SYRINGE) ×3 IMPLANT
SYR BULB 3OZ (MISCELLANEOUS) ×3 IMPLANT
SYR CONTROL 10ML LL (SYRINGE) ×3 IMPLANT
TOWEL GREEN STERILE FF (TOWEL DISPOSABLE) ×3 IMPLANT
TRAY DSU PREP LF (CUSTOM PROCEDURE TRAY) ×3 IMPLANT
TUBE FEEDING ENTERAL 5FR 16IN (TUBING) ×3 IMPLANT
UNDERPAD 30X30 (UNDERPADS AND DIAPERS) ×3 IMPLANT
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
## 2017-10-09 NOTE — H&P (Signed)
  [name] is an 57 y.o. male.   Chief Complaint: left hand infection HPI: 57 yo male s/p incision and drainage multiple abscesses left hand.  Has had swelling and erythema of finger over past few days.  No fevers, chills, night sweats.  Restarted antibiotics.  Allergies: No Known Allergies  Past Medical History:  Diagnosis Date  . Bipolar 1 disorder (HCC)   . Hepatitis C   . Hepatitis C virus   . Post traumatic stress disorder (PTSD)   . PTSD (post-traumatic stress disorder)   . Schizophrenia, schizo-affective (HCC)     Past Surgical History:  Procedure Laterality Date  . APPENDECTOMY    . APPENDECTOMY    . CERVICAL SPINE SURGERY    . GANGLION CYST EXCISION    . I&D EXTREMITY Left 09/21/2017   Procedure: IRRIGATION AND DEBRIDEMENT HAND;  Surgeon: Betha LoaKuzma, Eliezer Khawaja, MD;  [facility];  Service: Orthopedics;  Laterality: Left;  . SHOULDER SURGERY      Family History: Family History  Problem Relation Age of Onset  . Hypertension Mother   . Diabetes Father   . Hypertension Brother     Social History:   reports that he has been smoking cigarettes and cigars.  He has never used smokeless tobacco. He reports that he drinks alcohol. He reports that he has current or past drug history. Drugs: Cocaine, Marijuana, and Methamphetamines.  Medications: Medications Prior to Admission  Medication Sig Dispense Refill  . HYDROcodone-acetaminophen (NORCO/VICODIN) 5-325 MG tablet Take 1 tablet by mouth every 4 (four) hours as needed for moderate pain. 10 tablet 0  . amoxicillin-clavulanate (AUGMENTIN) 875-125 MG tablet Take 1 tablet by mouth every 12 (twelve) hours. 14 tablet 0  . citalopram (CELEXA) 20 MG tablet Take 1 tablet (20 mg total) by mouth daily. (Patient not taking: Reported on 09/21/2017) 30 tablet 0  . QUEtiapine (SEROQUEL) 100 MG tablet Take 5 tablets (500 mg total) by mouth at bedtime. (Patient not taking: Reported on 09/21/2017) 70 tablet 0  . traZODone (DESYREL) 50 MG  tablet Take 1 tablet (50 mg total) by mouth at bedtime as needed and may repeat dose one time if needed for sleep. (Patient not taking: Reported on 09/21/2017) 14 tablet 0    No results found for this or any previous visit (from the past 48 hour(s)).  No results found.   A comprehensive review of systems was negative.  Blood pressure 125/67, pulse 67, temperature 98.1 F (36.7 C), temperature source Oral, resp. rate 18, height 6\' 1"  (1.854 m), weight 82.1 kg (181 lb), SpO2 97 %.  General appearance: alert, cooperative and appears stated age Head: Normocephalic, without obvious abnormality, atraumatic Neck: supple, symmetrical, trachea midline Cardio: regular rate and rhythm Resp: clear to auscultation bilaterally Extremities: Intact sensation and capillary refill all digits.  +epl/fpl/io.  Previous surgical wounds with drainage. Pulses: 2+ and symmetric Skin: Skin color, texture, turgor normal. No rashes or lesions Neurologic: Grossly normal Incision/Wound: as above  Assessment/Plan Left index finger/hand abscesses.  Recommend repeat incision and drainage.  Non operative and operative treatment options were discussed with the patient and patient wishes to proceed with operative treatment. Risks, benefits, and alternatives of surgery were discussed and the patient agrees with the plan of care.   [name] 10/09/2017, 12:31 PM

## 2017-10-09 NOTE — Anesthesia Postprocedure Evaluation (Signed)
Anesthesia Post Note  Patient: Rhona LeavensLavaughn C Mozingo  Procedure(s) Performed: INCISION AND DRAINAGE LEFT HAND (Left Hand)     Patient location during evaluation: PACU Anesthesia Type: General Level of consciousness: awake and alert Pain management: pain level controlled Vital Signs Assessment: post-procedure vital signs reviewed and stable Respiratory status: spontaneous breathing, nonlabored ventilation, respiratory function stable and patient connected to nasal cannula oxygen Cardiovascular status: blood pressure returned to baseline and stable Postop Assessment: no apparent nausea or vomiting Anesthetic complications: no    Last Vitals:  Vitals:   10/09/17 1415 10/09/17 1430  BP: (!) 142/78 (!) 155/81  Pulse: (!) 57 62  Resp: 17 13  Temp:    SpO2: 100% 99%    Last Pain:  Vitals:   10/09/17 1430  TempSrc:   PainSc: 3                  Shelton SilvasKevin D Bellamarie Pflug

## 2017-10-09 NOTE — Discharge Instructions (Addendum)

## 2017-10-09 NOTE — Op Note (Signed)
NAME: [name] MEDICAL RECORD NO: [id number] DATE OF BIRTH: [birth year] [facility] LOCATION: [facility] PHYSICIAN: Tami RibasKEVIN R. Pauline Trainer, MD   OPERATIVE REPORT   DATE OF PROCEDURE: 10/09/17    PREOPERATIVE DIAGNOSIS:   Left index finger flexor sheath infection, index finger abscess, DIP joint infection   POSTOPERATIVE DIAGNOSIS:   Left index finger flexor sheath infection, index finger abscess, DIP joint infection   PROCEDURE:   Left index finger incision and drainage of sub-cutaneous abscess, repeat incision and drainage of flexor sheath abscess, irrigation and debridement of DIP joint infection   SURGEON:  Betha LoaKevin Evolette Pendell, M.D.   ASSISTANT: none   ANESTHESIA:  General   INTRAVENOUS FLUIDS:  Per anesthesia flow sheet.   ESTIMATED BLOOD LOSS:  Minimal.   COMPLICATIONS:  None.   SPECIMENS:   Cultures to micro   TOURNIQUET TIME:    Total Tourniquet Time Documented: Upper Arm (Left) - 27 minutes Total: Upper Arm (Left) - 27 minutes    DISPOSITION:  Stable to PACU.   INDICATIONS: 57 year old male who underwent incision and drainage of left hand and index finger for multiple abscesses 2 and half weeks ago.  He returned to the emergency department over the weekend with recurrence of swelling pain and erythema of the index finger.  He was restarted on antibiotics.  I recommended repeat incision and drainage. Risks, benefits and alternatives of surgery were discussed including the risks of blood loss, infection, damage to nerves, vessels, tendons, ligaments, bone for surgery, need for additional surgery, complications with wound healing, continued pain, nonunion, malunion, stiffness.  He voiced understanding of these risks and elected to proceed.  OPERATIVE COURSE:  After being identified preoperatively by myself,  the patient and I agreed on the procedure and site of the procedure.  The surgical site was marked.  Surgical consent had been signed.  Antibiotic's  were held for cultures.  He was transferred to the operating room and placed on the operating table in supine position with the Left upper extremity on an arm board.  General anesthesia was induced by the anesthesiologist.  Left upper extremity was prepped and draped in normal sterile orthopedic fashion.  A surgical pause was performed between the surgeons, anesthesia, and operating room staff and all were in agreement as to the patient, procedure, and site of procedure.  Tourniquet at the proximal aspect of the extremity was inflated to 250 mmHg after exsanguination of the arm with an Esmarch bandage.    The wounds were probed.  There was gross purulence from the distal phalanx wounds both volarly and dorsally and in the palmar wound for the flexor sheath drainage.  Cultures were taken.  Incision was made on the dorsum of the index finger over the proximal phalanx in the area of tenderness and skin change.  There is no gross purulence.  An additional incision was made volarly on the proximal phalanx where he was swollen and tender as well.  The wounds in the distal phalanx and palm for the flexor sheath drainage previously were re-created.  There is gross purulence.  Aundria RudRogers were used to lightly debride degenerative and infected looking tissue surrounding the flexor tendons.  Flexor tendons were intact.  A #5 pediatric feeding tube was placed into the finger from proximally.  This was used to copiously irrigate the flexor sheath.  Was then placed from distally.  Is able to place through the entire sheath.  Irrigation from distally provided good the fluid from all 3  wounds.  All wounds were copiously irrigated with sterile saline and then packed with quarter inch iodoform gauze.  There was then dressed with sterile Xeroform on the dorsal wound as well as 4 x 4's and wrapped with a Kerlix bandage.  The feeding tube was left and is a drain.  A volar splint was placed and wrapped with Kerlix and Ace bandage.  The  tourniquet was deflated at 27 minutes.  Fingertips were pink with brisk capillary refill after deflation of tourniquet.  The operative  drapes were broken down.  The patient was awoken from anesthesia safely.  He was transferred back to the stretcher and taken to PACU in stable condition.  I will see him back in the office in 3-4 days for postoperative followup.  I will give him a prescription for Norco 5/325 1-2 tabs PO q6 hours prn pain, dispense # 20 and Bactrim DS 1 p.o. twice daily x10 days.   Tami Ribas, MD Electronically signed, 10/09/17

## 2017-10-09 NOTE — Anesthesia Procedure Notes (Signed)
Procedure Name: LMA Insertion Date/Time: 10/09/2017 1:07 PM Performed by: Pearson Grippeobertson, Sanjay Broadfoot M, CRNA Pre-anesthesia Checklist: Patient identified, Emergency Drugs available, Suction available and Patient being monitored Patient Re-evaluated:Patient Re-evaluated prior to induction Oxygen Delivery Method: Circle system utilized Preoxygenation: Pre-oxygenation with 100% oxygen Induction Type: IV induction Ventilation: Mask ventilation without difficulty LMA: LMA inserted LMA Size: 4.0 Number of attempts: 1 Airway Equipment and Method: Bite block Placement Confirmation: positive ETCO2 Tube secured with: Tape Dental Injury: Teeth and Oropharynx as per pre-operative assessment

## 2017-10-09 NOTE — Transfer of Care (Signed)
Immediate Anesthesia Transfer of Care Note  Patient: [name]  Procedure(s) Performed: INCISION AND DRAINAGE LEFT HAND (Left Hand)  Patient Location: PACU  Anesthesia Type:General  Level of Consciousness: awake, alert  and oriented  Airway & Oxygen Therapy: Patient Spontanous Breathing and Patient connected to face mask oxygen  Post-op Assessment: Report given to RN and Post -op Vital signs reviewed and stable  Post vital signs: Reviewed and stable  Last Vitals:  Vitals Value Taken Time  BP 139/85 10/09/2017  1:54 PM  Temp    Pulse 66 10/09/2017  1:58 PM  Resp 13 10/09/2017  1:58 PM  SpO2 100 % 10/09/2017  1:58 PM  Vitals shown include unvalidated device data.  Last Pain:  Vitals:   10/09/17 1122  TempSrc: Oral  PainSc: 8          Complications: No apparent anesthesia complications

## 2017-10-10 ENCOUNTER — Encounter (HOSPITAL_BASED_OUTPATIENT_CLINIC_OR_DEPARTMENT_OTHER): Payer: Self-pay | Admitting: Orthopedic Surgery

## 2017-10-14 LAB — AEROBIC/ANAEROBIC CULTURE (SURGICAL/DEEP WOUND)

## 2017-10-14 LAB — AEROBIC/ANAEROBIC CULTURE W GRAM STAIN (SURGICAL/DEEP WOUND)

## 2017-10-24 LAB — FUNGUS CULTURE WITH STAIN

## 2017-10-24 LAB — FUNGUS CULTURE RESULT

## 2017-10-24 LAB — FUNGAL ORGANISM REFLEX

## 2017-11-09 LAB — FUNGUS CULTURE WITH STAIN

## 2017-11-09 LAB — FUNGAL ORGANISM REFLEX

## 2017-11-09 LAB — FUNGUS CULTURE RESULT

## 2017-11-12 ENCOUNTER — Emergency Department (HOSPITAL_COMMUNITY)
Admission: EM | Admit: 2017-11-12 | Discharge: 2017-11-12 | Disposition: A | Discharge status: Home / Self Care | Payer: Medicare Other | Attending: Emergency Medicine | Admitting: Emergency Medicine

## 2017-11-12 ENCOUNTER — Encounter (HOSPITAL_COMMUNITY): Payer: Self-pay | Admitting: Emergency Medicine

## 2017-11-12 ENCOUNTER — Emergency Department (HOSPITAL_COMMUNITY): Payer: Medicare Other

## 2017-11-12 DIAGNOSIS — F17210 Nicotine dependence, cigarettes, uncomplicated: Secondary | ICD-10-CM | POA: Diagnosis not present

## 2017-11-12 DIAGNOSIS — M79642 Pain in left hand: Secondary | ICD-10-CM | POA: Diagnosis present

## 2017-11-12 DIAGNOSIS — M86142 Other acute osteomyelitis, left hand: Secondary | ICD-10-CM | POA: Insufficient documentation

## 2017-11-12 DIAGNOSIS — M869 Osteomyelitis, unspecified: Secondary | ICD-10-CM

## 2017-11-12 LAB — CBC WITH DIFFERENTIAL/PLATELET
Abs Immature Granulocytes: 0 10*3/uL (ref 0.0–0.1)
Basophils Absolute: 0 10*3/uL (ref 0.0–0.1)
Basophils Relative: 0 %
Eosinophils Absolute: 0.2 10*3/uL (ref 0.0–0.7)
Eosinophils Relative: 2 %
HCT: 42.6 % (ref 39.0–52.0)
Hemoglobin: 13.9 g/dL (ref 13.0–17.0)
Immature Granulocytes: 0 %
Lymphocytes Relative: 10 %
Lymphs Abs: 1 10*3/uL (ref 0.7–4.0)
MCH: 32.1 pg (ref 26.0–34.0)
MCHC: 32.6 g/dL (ref 30.0–36.0)
MCV: 98.4 fL (ref 78.0–100.0)
Monocytes Absolute: 1 10*3/uL (ref 0.1–1.0)
Monocytes Relative: 9 %
Neutro Abs: 8.2 10*3/uL — ABNORMAL HIGH (ref 1.7–7.7)
Neutrophils Relative %: 79 %
Platelets: 192 10*3/uL (ref 150–400)
RBC: 4.33 MIL/uL (ref 4.22–5.81)
RDW: 13.2 % (ref 11.5–15.5)
WBC: 10.4 10*3/uL (ref 4.0–10.5)

## 2017-11-12 LAB — COMPREHENSIVE METABOLIC PANEL
ALT: 20 U/L (ref 0–44)
AST: 28 U/L (ref 15–41)
Albumin: 4 g/dL (ref 3.5–5.0)
Alkaline Phosphatase: 61 U/L (ref 38–126)
Anion gap: 10 (ref 5–15)
BUN: 14 mg/dL (ref 6–20)
CO2: 25 mmol/L (ref 22–32)
Calcium: 9.5 mg/dL (ref 8.9–10.3)
Chloride: 101 mmol/L (ref 98–111)
Creatinine, Ser: 1.02 mg/dL (ref 0.61–1.24)
GFR calc Af Amer: >60 mL/min (ref >60)
GFR calc non Af Amer: >60 mL/min (ref >60)
Glucose, Bld: 136 mg/dL — ABNORMAL HIGH (ref 70–99)
Potassium: 3.7 mmol/L (ref 3.5–5.1)
Sodium: 136 mmol/L (ref 135–145)
Total Bilirubin: 0.7 mg/dL (ref 0.3–1.2)
Total Protein: 7.2 g/dL (ref 6.5–8.1)

## 2017-11-12 LAB — PROTIME-INR
INR: 0.98
Prothrombin Time: 12.9 seconds (ref 11.4–15.2)

## 2017-11-12 LAB — I-STAT CG4 LACTIC ACID, ED: Lactic Acid, Venous: 1.38 mmol/L (ref 0.5–1.9)

## 2017-11-12 MED ORDER — HYDROCODONE-ACETAMINOPHEN 5-325 MG PO TABS: 1.0000 | ORAL_TABLET | Freq: Four times a day (QID) | ORAL | 0 refills | Status: DC | PRN

## 2017-11-12 MED ORDER — HYDROCODONE-ACETAMINOPHEN 5-325 MG PO TABS
1.0000 | ORAL_TABLET | Freq: Once | ORAL | Status: AC
  Administered 2017-11-12: 1 via ORAL
  Filled 2017-11-12: qty 1

## 2017-11-12 MED ORDER — ONDANSETRON 4 MG PO TBDP
4.0000 mg | ORAL_TABLET | Freq: Once | ORAL | Status: AC
  Administered 2017-11-12: 4 mg via ORAL
  Filled 2017-11-12: qty 1

## 2017-11-12 MED ORDER — SULFAMETHOXAZOLE-TRIMETHOPRIM 800-160 MG PO TABS: 1.0000 | ORAL_TABLET | Freq: Two times a day (BID) | ORAL | 0 refills | Status: DC

## 2017-11-12 MED ORDER — CEFAZOLIN SODIUM-DEXTROSE 2-4 GM/100ML-% IV SOLN
2.0000 g | Freq: Once | INTRAVENOUS | Status: AC
  Administered 2017-11-12: 2 g via INTRAVENOUS
  Filled 2017-11-12: qty 100

## 2017-11-12 MED ORDER — ONDANSETRON 4 MG PO TBDP: 4.0000 mg | ORAL_TABLET | Freq: Three times a day (TID) | ORAL | 0 refills | Status: AC | PRN
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
## 2017-11-12 NOTE — ED Notes (Signed)
Patient is A&Ox4 at this time.  Patient in no signs of distress.  Please see providers note for complete history and physical exam.  

## 2017-11-12 NOTE — ED Notes (Signed)
Patient Alert and oriented to baseline. Stable and ambulatory to baseline. Patient verbalized understanding of the discharge instructions.  Patient belongings were taken by the patient.   

## 2017-11-12 NOTE — Discharge Instructions (Signed)
Thank you for allowing me to care for you today in the Emergency Department.   Go to Dr. Merrilee SeashoreKuzma's office tomorrow morning at 9:30 AM.  He is expecting you.  You were given antibiotics tonight in the emergency department.  Take 1 dose of Bactrim in the morning and make sure to pick up your other prescription from the pharmacy.  Take 650 mg of Tylenol every 6 hours for pain that is mild to moderate.  For severe pain, you may take 1 tablet of Norco every 8 hours.  You have been given a small prescription in the emergency department, but if you have ongoing pain that should be discussed with Dr. Merlyn LotKuzma.  Return to the emergency department if you develop a high fever, red streaking down the hand, persistent vomiting, or other new concerning symptoms.

## 2017-11-12 NOTE — ED Triage Notes (Signed)
Pt arrives with increased pain to L hand, reports hx staph infection since May. Reports today pain and swelling in L first finger. Reports crack cocaine weekly.

## 2017-11-13 ENCOUNTER — Encounter (HOSPITAL_BASED_OUTPATIENT_CLINIC_OR_DEPARTMENT_OTHER): Payer: Self-pay | Admitting: *Deleted

## 2017-11-13 ENCOUNTER — Other Ambulatory Visit: Payer: Self-pay | Admitting: Orthopedic Surgery

## 2017-11-13 ENCOUNTER — Ambulatory Visit (HOSPITAL_BASED_OUTPATIENT_CLINIC_OR_DEPARTMENT_OTHER): Payer: Medicare Other | Admitting: Certified Registered"

## 2017-11-13 ENCOUNTER — Ambulatory Visit (HOSPITAL_BASED_OUTPATIENT_CLINIC_OR_DEPARTMENT_OTHER)
Admission: RE | Admit: 2017-11-13 | Discharge: 2017-11-14 | Disposition: A | Discharge status: Home / Self Care | Payer: Medicare Other | Source: Ambulatory Visit | Attending: Orthopedic Surgery | Admitting: Orthopedic Surgery

## 2017-11-13 ENCOUNTER — Encounter (HOSPITAL_BASED_OUTPATIENT_CLINIC_OR_DEPARTMENT_OTHER)
Admission: RE | Disposition: A | Discharge status: Home / Self Care | Payer: Self-pay | Source: Ambulatory Visit | Attending: Orthopedic Surgery

## 2017-11-13 ENCOUNTER — Other Ambulatory Visit: Payer: Self-pay

## 2017-11-13 DIAGNOSIS — M869 Osteomyelitis, unspecified: Secondary | ICD-10-CM | POA: Insufficient documentation

## 2017-11-13 DIAGNOSIS — L02512 Cutaneous abscess of left hand: Secondary | ICD-10-CM | POA: Diagnosis not present

## 2017-11-13 DIAGNOSIS — F17210 Nicotine dependence, cigarettes, uncomplicated: Secondary | ICD-10-CM | POA: Diagnosis not present

## 2017-11-13 DIAGNOSIS — M009 Pyogenic arthritis, unspecified: Secondary | ICD-10-CM | POA: Diagnosis present

## 2017-11-13 DIAGNOSIS — F17290 Nicotine dependence, other tobacco product, uncomplicated: Secondary | ICD-10-CM | POA: Diagnosis not present

## 2017-11-13 HISTORY — PX: INCISION AND DRAINAGE: SHX5863

## 2017-11-13 SURGERY — INCISION AND DRAINAGE
Anesthesia: General | Site: Finger | Laterality: Left

## 2017-11-13 MED ORDER — FENTANYL CITRATE (PF) 100 MCG/2ML IJ SOLN: 50.0000 ug | INTRAMUSCULAR | Status: DC | PRN

## 2017-11-13 MED ORDER — TRAMADOL HCL 50 MG PO TABS
ORAL_TABLET | ORAL | 0 refills | Status: AC
Start: 2017-11-13 — End: ?

## 2017-11-13 MED ORDER — FENTANYL CITRATE (PF) 100 MCG/2ML IJ SOLN
INTRAMUSCULAR | Status: AC
  Filled 2017-11-13: qty 2

## 2017-11-13 MED ORDER — METHOCARBAMOL 1000 MG/10ML IJ SOLN: 500.0000 mg | Freq: Four times a day (QID) | INTRAMUSCULAR | Status: DC | PRN

## 2017-11-13 MED ORDER — CEFAZOLIN SODIUM-DEXTROSE 2-4 GM/100ML-% IV SOLN
INTRAVENOUS | Status: AC
  Filled 2017-11-13: qty 100

## 2017-11-13 MED ORDER — PROMETHAZINE HCL 25 MG/ML IJ SOLN: 6.2500 mg | INTRAMUSCULAR | Status: DC | PRN

## 2017-11-13 MED ORDER — ONDANSETRON HCL 4 MG/2ML IJ SOLN
INTRAMUSCULAR | Status: DC | PRN
  Administered 2017-11-13: 4 mg via INTRAVENOUS

## 2017-11-13 MED ORDER — LIDOCAINE HCL (CARDIAC) PF 100 MG/5ML IV SOSY
PREFILLED_SYRINGE | INTRAVENOUS | Status: DC | PRN
  Administered 2017-11-13: 60 mg via INTRAVENOUS

## 2017-11-13 MED ORDER — CEFAZOLIN SODIUM-DEXTROSE 2-3 GM-%(50ML) IV SOLR
INTRAVENOUS | Status: DC | PRN
  Administered 2017-11-13: 2 g via INTRAVENOUS

## 2017-11-13 MED ORDER — HYDROMORPHONE HCL 1 MG/ML IJ SOLN: 0.2500 mg | INTRAMUSCULAR | Status: DC | PRN

## 2017-11-13 MED ORDER — HYDROCODONE-ACETAMINOPHEN 7.5-325 MG PO TABS: 1.0000 | ORAL_TABLET | Freq: Once | ORAL | Status: DC | PRN

## 2017-11-13 MED ORDER — SCOPOLAMINE 1 MG/3DAYS TD PT72: 1.0000 | MEDICATED_PATCH | Freq: Once | TRANSDERMAL | Status: DC | PRN

## 2017-11-13 MED ORDER — FENTANYL CITRATE (PF) 100 MCG/2ML IJ SOLN
INTRAMUSCULAR | Status: DC | PRN
  Administered 2017-11-13 (×2): 50 ug via INTRAVENOUS

## 2017-11-13 MED ORDER — MIDAZOLAM HCL 2 MG/2ML IJ SOLN: 1.0000 mg | INTRAMUSCULAR | Status: DC | PRN

## 2017-11-13 MED ORDER — CEPHALEXIN 500 MG PO CAPS
500.0000 mg | ORAL_CAPSULE | Freq: Four times a day (QID) | ORAL | Status: DC
  Administered 2017-11-13 – 2017-11-14 (×3): 500 mg via ORAL
  Filled 2017-11-13 (×4): qty 1

## 2017-11-13 MED ORDER — ONDANSETRON HCL 4 MG/2ML IJ SOLN: 4.0000 mg | Freq: Four times a day (QID) | INTRAMUSCULAR | Status: DC | PRN

## 2017-11-13 MED ORDER — MIDAZOLAM HCL 2 MG/2ML IJ SOLN
INTRAMUSCULAR | Status: AC
  Filled 2017-11-13: qty 2

## 2017-11-13 MED ORDER — METHOCARBAMOL 500 MG PO TABS
500.0000 mg | ORAL_TABLET | Freq: Four times a day (QID) | ORAL | Status: DC | PRN
  Administered 2017-11-13 – 2017-11-14 (×3): 500 mg via ORAL
  Filled 2017-11-13 (×3): qty 1

## 2017-11-13 MED ORDER — BUPIVACAINE HCL (PF) 0.25 % IJ SOLN
INTRAMUSCULAR | Status: DC | PRN
  Administered 2017-11-13: 10 mL

## 2017-11-13 MED ORDER — SULFAMETHOXAZOLE-TRIMETHOPRIM 800-160 MG PO TABS
2.0000 | ORAL_TABLET | Freq: Two times a day (BID) | ORAL | Status: DC
  Administered 2017-11-13 – 2017-11-14 (×2): 2 via ORAL
  Filled 2017-11-13 (×2): qty 2

## 2017-11-13 MED ORDER — ONDANSETRON HCL 4 MG PO TABS: 4.0000 mg | ORAL_TABLET | Freq: Four times a day (QID) | ORAL | Status: DC | PRN

## 2017-11-13 MED ORDER — MIDAZOLAM HCL 5 MG/5ML IJ SOLN
INTRAMUSCULAR | Status: DC | PRN
  Administered 2017-11-13: 2 mg via INTRAVENOUS

## 2017-11-13 MED ORDER — PROPOFOL 10 MG/ML IV BOLUS
INTRAVENOUS | Status: DC | PRN
  Administered 2017-11-13: 150 mg via INTRAVENOUS

## 2017-11-13 MED ORDER — DIPHENHYDRAMINE HCL 25 MG PO CAPS: 25.0000 mg | ORAL_CAPSULE | Freq: Four times a day (QID) | ORAL | Status: DC | PRN

## 2017-11-13 MED ORDER — TEMAZEPAM 15 MG PO CAPS: 15.0000 mg | ORAL_CAPSULE | Freq: Every evening | ORAL | Status: DC | PRN

## 2017-11-13 MED ORDER — LACTATED RINGERS IV SOLN
INTRAVENOUS | Status: DC
  Administered 2017-11-13 (×2): via INTRAVENOUS

## 2017-11-13 MED ORDER — DEXAMETHASONE SODIUM PHOSPHATE 10 MG/ML IJ SOLN
INTRAMUSCULAR | Status: DC | PRN
  Administered 2017-11-13: 10 mg via INTRAVENOUS

## 2017-11-13 MED ORDER — SULFAMETHOXAZOLE-TRIMETHOPRIM 800-160 MG PO TABS: 1.0000 | ORAL_TABLET | Freq: Two times a day (BID) | ORAL | 0 refills | Status: DC

## 2017-11-13 MED ORDER — LACTATED RINGERS IV SOLN
INTRAVENOUS | Status: DC
  Administered 2017-11-13: 15:00:00 via INTRAVENOUS

## 2017-11-13 MED ORDER — CHLORHEXIDINE GLUCONATE 4 % EX LIQD: 60.0000 mL | Freq: Once | CUTANEOUS | Status: DC

## 2017-11-13 MED ORDER — HYDROCODONE-ACETAMINOPHEN 5-325 MG PO TABS
1.0000 | ORAL_TABLET | Freq: Four times a day (QID) | ORAL | Status: DC | PRN
  Administered 2017-11-13 (×2): 1 via ORAL
  Administered 2017-11-14 (×2): 2 via ORAL
  Filled 2017-11-13: qty 2
  Filled 2017-11-13: qty 1
  Filled 2017-11-13: qty 2
  Filled 2017-11-13: qty 1

## 2017-11-13 MED ORDER — MEPERIDINE HCL 25 MG/ML IJ SOLN: 6.2500 mg | INTRAMUSCULAR | Status: DC | PRN

## 2017-11-13 MED ORDER — LIDOCAINE HCL (CARDIAC) PF 100 MG/5ML IV SOSY
PREFILLED_SYRINGE | INTRAVENOUS | Status: AC
  Filled 2017-11-13: qty 10

## 2017-11-13 MED ORDER — VITAMIN C 500 MG PO TABS: 1000.0000 mg | ORAL_TABLET | Freq: Every day | ORAL | Status: DC

## 2017-11-13 MED ORDER — MORPHINE SULFATE (PF) 2 MG/ML IV SOLN: 1.0000 mg | INTRAVENOUS | Status: DC | PRN

## 2017-11-13 SURGICAL SUPPLY — 50 items
BAG DECANTER FOR FLEXI CONT (MISCELLANEOUS) IMPLANT
BANDAGE ACE 3X5.8 VEL STRL LF (GAUZE/BANDAGES/DRESSINGS) IMPLANT
BLADE MINI RND TIP GREEN BEAV (BLADE) IMPLANT
BLADE SURG 15 STRL LF DISP TIS (BLADE) ×2 IMPLANT
BLADE SURG 15 STRL SS (BLADE) ×4
BNDG COHESIVE 1X5 TAN STRL LF (GAUZE/BANDAGES/DRESSINGS) IMPLANT
BNDG ELASTIC 2X5.8 VLCR STR LF (GAUZE/BANDAGES/DRESSINGS) IMPLANT
BNDG ESMARK 4X9 LF (GAUZE/BANDAGES/DRESSINGS) IMPLANT
BNDG GAUZE 1X2.1 STRL (MISCELLANEOUS) IMPLANT
BNDG GAUZE ELAST 4 BULKY (GAUZE/BANDAGES/DRESSINGS) IMPLANT
CHLORAPREP W/TINT 26ML (MISCELLANEOUS) ×3 IMPLANT
CORD BIPOLAR FORCEPS 12FT (ELECTRODE) ×3 IMPLANT
COVER BACK TABLE 60X90IN (DRAPES) ×3 IMPLANT
COVER MAYO STAND STRL (DRAPES) ×3 IMPLANT
CUFF TOURNIQUET SINGLE 18IN (TOURNIQUET CUFF) ×3 IMPLANT
DRAPE EXTREMITY T 121X128X90 (DRAPE) ×3 IMPLANT
DRAPE SURG 17X23 STRL (DRAPES) ×3 IMPLANT
GAUZE PACKING IODOFORM 1/4X15 (GAUZE/BANDAGES/DRESSINGS) ×3 IMPLANT
GAUZE SPONGE 4X4 12PLY STRL (GAUZE/BANDAGES/DRESSINGS) ×3 IMPLANT
GAUZE XEROFORM 1X8 LF (GAUZE/BANDAGES/DRESSINGS) ×3 IMPLANT
GLOVE BIO SURGEON STRL SZ7.5 (GLOVE) ×3 IMPLANT
GLOVE BIOGEL PI IND STRL 7.0 (GLOVE) ×3 IMPLANT
GLOVE BIOGEL PI IND STRL 8 (GLOVE) ×1 IMPLANT
GLOVE BIOGEL PI INDICATOR 7.0 (GLOVE) ×6
GLOVE BIOGEL PI INDICATOR 8 (GLOVE) ×2
GLOVE ECLIPSE 6.5 STRL STRAW (GLOVE) ×3 IMPLANT
GOWN STRL REUS W/ TWL LRG LVL3 (GOWN DISPOSABLE) ×1 IMPLANT
GOWN STRL REUS W/TWL LRG LVL3 (GOWN DISPOSABLE) ×2
GOWN STRL REUS W/TWL XL LVL3 (GOWN DISPOSABLE) ×3 IMPLANT
LOOP VESSEL MAXI BLUE (MISCELLANEOUS) IMPLANT
NEEDLE BLUNT 17GA (NEEDLE) IMPLANT
NEEDLE HYPO 25X1 1.5 SAFETY (NEEDLE) ×3 IMPLANT
NS IRRIG 1000ML POUR BTL (IV SOLUTION) ×3 IMPLANT
PACK BASIN DAY SURGERY FS (CUSTOM PROCEDURE TRAY) ×3 IMPLANT
PAD CAST 3X4 CTTN HI CHSV (CAST SUPPLIES) IMPLANT
PADDING CAST ABS 4INX4YD NS (CAST SUPPLIES)
PADDING CAST ABS COTTON 4X4 ST (CAST SUPPLIES) IMPLANT
PADDING CAST COTTON 3X4 STRL (CAST SUPPLIES)
SPLINT PLASTER CAST XFAST 3X15 (CAST SUPPLIES) IMPLANT
SPLINT PLASTER XTRA FASTSET 3X (CAST SUPPLIES)
STOCKINETTE 4X48 STRL (DRAPES) ×3 IMPLANT
SUT ETHILON 4 0 PS 2 18 (SUTURE) IMPLANT
SWAB COLLECTION DEVICE MRSA (MISCELLANEOUS) ×3 IMPLANT
SWAB CULTURE ESWAB REG 1ML (MISCELLANEOUS) IMPLANT
SYR 20CC LL (SYRINGE) IMPLANT
SYR BULB 3OZ (MISCELLANEOUS) ×3 IMPLANT
SYR CONTROL 10ML LL (SYRINGE) ×3 IMPLANT
TOWEL GREEN STERILE FF (TOWEL DISPOSABLE) ×6 IMPLANT
TUBE FEEDING ENTERAL 5FR 16IN (TUBING) IMPLANT
UNDERPAD 30X30 (UNDERPADS AND DIAPERS) ×3 IMPLANT

## 2017-11-13 NOTE — Anesthesia Preprocedure Evaluation (Addendum)
Anesthesia Evaluation  Patient identified by MRN, date of birth, ID band Patient awake    Reviewed: Allergy & Precautions, NPO status , Patient's Chart, lab work & pertinent test results  Airway Mallampati: II  TM Distance: >3 FB Neck ROM: Full    Dental  (+) Teeth Intact, Dental Advisory Given, Chipped,    Pulmonary Current Smoker,    breath sounds clear to auscultation       Cardiovascular negative cardio ROS   Rhythm:Regular Rate:Normal     Neuro/Psych PSYCHIATRIC DISORDERS Anxiety Bipolar Disorder Schizophrenia    GI/Hepatic negative GI ROS, (+)     substance abuse  alcohol use and cocaine use, Hepatitis -, CLast cocaine use 3 days ago   Endo/Other  negative endocrine ROS  Renal/GU negative Renal ROS  negative genitourinary   Musculoskeletal Infection left index finger   Abdominal Normal abdominal exam  (+)   Peds  Hematology negative hematology ROS (+)   Anesthesia Other Findings   Reproductive/Obstetrics                            Lab Results  Component Value Date   WBC 10.4 11/12/2017   HGB 13.9 11/12/2017   HCT 42.6 11/12/2017   MCV 98.4 11/12/2017   PLT 192 11/12/2017   Lab Results  Component Value Date   CREATININE 1.02 11/12/2017   BUN 14 11/12/2017   NA 136 11/12/2017   K 3.7 11/12/2017   CL 101 11/12/2017   CO2 25 11/12/2017   Lab Results  Component Value Date   INR 0.98 11/12/2017   INR 1.17 02/24/2015   EKG: normal sinus rhythm.  Anesthesia Physical  Anesthesia Plan  ASA: II  Anesthesia Plan: General   Post-op Pain Management:    Induction: Intravenous  PONV Risk Score and Plan: 2 and Ondansetron, Dexamethasone and Midazolam  Airway Management Planned: LMA  Additional Equipment: None  Intra-op Plan:   Post-operative Plan: Extubation in OR  Informed Consent: I have reviewed the patients History and Physical, chart, labs and discussed the  procedure including the risks, benefits and alternatives for the proposed anesthesia with the patient or authorized representative who has indicated his/her understanding and acceptance.   Dental advisory given  Plan Discussed with: CRNA and Surgeon  Anesthesia Plan Comments:         Anesthesia Quick Evaluation

## 2017-11-13 NOTE — H&P (Signed)
[name] is an 57 y.o. male.   Chief Complaint: left index finger infection HPI: 57 yo male s/p repeat incision and drainage left index finger with reaccumulation of abscess at dip joint.  No fevers.  Some chills.  Seen in ED where afebrile and WBC normal range.  Presents for repeat incision and drainage of left index finger.  Allergies: No Known Allergies  Past Medical History:  Diagnosis Date  . Bipolar 1 disorder ([facility])   . Hepatitis C   . Hepatitis C virus   . Post traumatic stress disorder (PTSD)   . PTSD (post-traumatic stress disorder)   . Schizophrenia, schizo-affective ([place])     Past Surgical History:  Procedure Laterality Date  . APPENDECTOMY    . APPENDECTOMY    . CERVICAL SPINE SURGERY    . GANGLION CYST EXCISION    . I&D EXTREMITY Left 09/21/2017   Procedure: IRRIGATION AND DEBRIDEMENT HAND;  Surgeon: Leanora Cover, MD;  Location: [facility];  Service: Orthopedics;  Laterality: Left;  . INCISION AND DRAINAGE Left 10/09/2017   Procedure: INCISION AND DRAINAGE LEFT HAND;  Surgeon: Leanora Cover, MD;  Location: [facility];  Service: Orthopedics;  Laterality: Left;  . SHOULDER SURGERY      Family History: Family History  Problem Relation Age of Onset  . Hypertension Mother   . Diabetes Father   . Hypertension Brother     Social History:   reports that he has been smoking cigarettes and cigars.  He has never used smokeless tobacco. He reports that he drinks alcohol. He reports that he has current or past drug history. Drugs: Cocaine, Marijuana, and Methamphetamines.  Medications: Medications Prior to Admission  Medication Sig Dispense Refill  . HYDROcodone-acetaminophen (NORCO/VICODIN) 5-325 MG tablet Take 1 tablet by mouth every 6 (six) hours as needed. 4 tablet 0  . ondansetron (ZOFRAN ODT) 4 MG disintegrating tablet Take 1 tablet (4 mg total) by mouth every 8 (eight) hours as needed for nausea or vomiting. 20 tablet 0    Results for orders  placed or performed during the hospital encounter of 11/12/17 (from the past 48 hour(s))  Culture, blood (Routine x 2)     Status: None (Preliminary result)   Collection Time: 11/12/17  7:15 PM  Result Value Ref Range   Specimen Description BLOOD RIGHT ANTECUBITAL    Special Requests      BOTTLES DRAWN AEROBIC AND ANAEROBIC Blood Culture adequate volume   Culture      NO GROWTH < 12 HOURS Performed at [facility] [address], [place], [place] [address]    Report Status PENDING   Comprehensive metabolic panel     Status: Abnormal   Collection Time: 11/12/17  7:18 PM  Result Value Ref Range   Sodium 136 135 - 145 mmol/L   Potassium 3.7 3.5 - 5.1 mmol/L   Chloride 101 98 - 111 mmol/L    Comment: Please note change in reference range.   CO2 25 22 - 32 mmol/L   Glucose, Bld 136 (H) 70 - 99 mg/dL    Comment: Please note change in reference range.   BUN 14 6 - 20 mg/dL    Comment: Please note change in reference range.   Creatinine, Ser 1.02 0.61 - 1.24 mg/dL   Calcium 9.5 8.9 - 10.3 mg/dL   Total Protein 7.2 6.5 - 8.1 g/dL   Albumin 4.0 3.5 - 5.0 g/dL   AST 28 15 - 41 U/L  ALT 20 0 - 44 U/L    Comment: Please note change in reference range.   Alkaline Phosphatase 61 38 - 126 U/L   Total Bilirubin 0.7 0.3 - 1.2 mg/dL   GFR calc non Af Amer >60 >60 mL/min   GFR calc Af Amer >60 >60 mL/min    Comment: (NOTE) The eGFR has been calculated using the CKD EPI equation. This calculation has not been validated in all clinical situations. eGFR's persistently <60 mL/min signify possible Chronic Kidney Disease.    Anion gap 10 5 - 15    Comment: Performed at [facility] [address] [place], [place] [address]  CBC with Differential     Status: Abnormal   Collection Time: 11/12/17  7:18 PM  Result Value Ref Range   WBC 10.4 4.0 - 10.5 K/uL   RBC 4.33 4.22 - 5.81 MIL/uL   Hemoglobin 13.9 13.0 - 17.0 g/dL   HCT 42.6 39.0 - 52.0 %   MCV 98.4 78.0 - 100.0 fL   MCH  32.1 26.0 - 34.0 pg   MCHC 32.6 30.0 - 36.0 g/dL   RDW 13.2 11.5 - 15.5 %   Platelets 192 150 - 400 K/uL   Neutrophils Relative % 79 %   Neutro Abs 8.2 (H) 1.7 - 7.7 K/uL   Lymphocytes Relative 10 %   Lymphs Abs 1.0 0.7 - 4.0 K/uL   Monocytes Relative 9 %   Monocytes Absolute 1.0 0.1 - 1.0 K/uL   Eosinophils Relative 2 %   Eosinophils Absolute 0.2 0.0 - 0.7 K/uL   Basophils Relative 0 %   Basophils Absolute 0.0 0.0 - 0.1 K/uL   Immature Granulocytes 0 %   Abs Immature Granulocytes 0.0 0.0 - 0.1 K/uL    Comment: Performed at [facility] [address], [place], [place] [address]  Protime-INR     Status: None   Collection Time: 11/12/17  7:18 PM  Result Value Ref Range   Prothrombin Time 12.9 11.4 - 15.2 seconds   INR 0.98     Comment: Performed at [facility] [address] [place], [place] [address]  Culture, blood (Routine x 2)     Status: None (Preliminary result)   Collection Time: 11/12/17  7:25 PM  Result Value Ref Range   Specimen Description BLOOD LEFT ANTECUBITAL    Special Requests      BOTTLES DRAWN AEROBIC AND ANAEROBIC Blood Culture adequate volume   Culture      NO GROWTH < 12 HOURS Performed at [facility] [address] [place], [place] [address]    Report Status PENDING   I-Stat CG4 Lactic Acid, ED     Status: None   Collection Time: 11/12/17  7:34 PM  Result Value Ref Range   Lactic Acid, Venous 1.38 0.5 - 1.9 mmol/L    Dg Hand Complete Left  Result Date: 11/12/2017 CLINICAL DATA:  infection to hand, r/o osteomyolitis EXAM: LEFT HAND - COMPLETE 3+ VIEW COMPARISON:  10/07/2017 FINDINGS: There is a soft tissue defect along the dorsal aspect of the distal index finger. There is resorption in focal osteopenia along the dorsal cortex the distal middle phalanx consistent with osteomyelitis, new since the prior study. More diffuse soft tissue swelling is seen from the mid through the distal index finger. No soft tissue air. No other  evidence of osteomyelitis. No fracture or dislocation. No other soft tissue abnormality. IMPRESSION: 1. Osteomyelitis of the distal aspect  of the middle phalanx of the index finger, deep to the overlying soft tissue ulceration/defect. Electronically Signed   By: Lajean Manes M.D.   On: 11/12/2017 19:58     A comprehensive review of systems was negative except for: Constitutional: positive for chills  Blood pressure 122/66, pulse 70, temperature 97.7 F (36.5 C), temperature source Oral, resp. rate 18, height 6' 1"  (1.854 m), weight 79.8 kg (176 lb), SpO2 98 %.  General appearance: alert, cooperative and appears stated age Head: Normocephalic, without obvious abnormality, atraumatic Neck: supple, symmetrical, trachea midline Cardio: regular rate and rhythm Resp: clear to auscultation bilaterally Extremities: Intact sensation and capillary refill all digits.  +epl/fpl/io.  Dorsal wound at dip joint left index with purulent drainage. Pulses: 2+ and symmetric Skin: Skin color, texture, turgor normal. No rashes or lesions Neurologic: Grossly normal Incision/Wound: as above  Assessment/Plan Left index finger dip joint abscess.  Non operative and operative treatment options were discussed with the patient and patient wishes to proceed with operative treatment. Risks, benefits, and alternatives of surgery were discussed and the patient agrees with the plan of care.   Zi Newbury R 11/13/2017, 12:19 PM

## 2017-11-13 NOTE — Discharge Instructions (Addendum)

## 2017-11-13 NOTE — Anesthesia Postprocedure Evaluation (Signed)
Anesthesia Post Note  Patient: [name]  Procedure(s) Performed: INCISION AND DRAINAGE LEFT INDEX FINGER (Left Finger)     Patient location during evaluation: PACU Anesthesia Type: General Level of consciousness: awake and alert Pain management: pain level controlled Vital Signs Assessment: post-procedure vital signs reviewed and stable Respiratory status: spontaneous breathing, nonlabored ventilation and respiratory function stable Cardiovascular status: blood pressure returned to baseline and stable Postop Assessment: no apparent nausea or vomiting Anesthetic complications: no    Last Vitals:  Vitals:   11/13/17 1345 11/13/17 1400  BP: 122/68 127/69  Pulse: 69 71  Resp: 13 12  Temp:    SpO2: 100% 96%    Last Pain:  Vitals:   11/13/17 1400  TempSrc:   PainSc: 0-No pain                 Tiffani Kadow A.

## 2017-11-13 NOTE — Anesthesia Procedure Notes (Signed)
Procedure Name: LMA Insertion Date/Time: 11/13/2017 1:00 PM Performed by: Sheryn BisonBlocker, Mireyah Chervenak D, CRNA Pre-anesthesia Checklist: Patient identified, Emergency Drugs available, Suction available and Patient being monitored Patient Re-evaluated:Patient Re-evaluated prior to induction Oxygen Delivery Method: Circle system utilized Preoxygenation: Pre-oxygenation with 100% oxygen Induction Type: IV induction Ventilation: Mask ventilation without difficulty LMA: LMA inserted LMA Size: 4.0 Number of attempts: 1 Airway Equipment and Method: Bite block Placement Confirmation: positive ETCO2 Tube secured with: Tape Dental Injury: Teeth and Oropharynx as per pre-operative assessment

## 2017-11-13 NOTE — Op Note (Signed)
NAME: [name] MEDICAL RECORD NO: [id number] DATE OF BIRTH: [birth year] FACILITY: [facility] LOCATION: [facility] PHYSICIAN: Tami Ribas, MD   OPERATIVE REPORT   DATE OF PROCEDURE: 11/13/17    PREOPERATIVE DIAGNOSIS:   Left index finger DIP joint abscess and osteomyelitis   POSTOPERATIVE DIAGNOSIS:   Left index finger DIP joint abscess and osteomyelitis   PROCEDURE:   Incision and drainage left index finger DIP joint and middle phalanx condylar osteomyelitis   SURGEON:  Betha Loa, M.D.   ASSISTANT: none   ANESTHESIA:  General   INTRAVENOUS FLUIDS:  Per anesthesia flow sheet.   ESTIMATED BLOOD LOSS:  Minimal.   COMPLICATIONS:  None.   SPECIMENS:   Cultures to micro   TOURNIQUET TIME:    Total Tourniquet Time Documented: Upper Arm (Left) - 21 minutes Total: Upper Arm (Left) - 21 minutes    DISPOSITION:  Stable to PACU.   INDICATIONS: 57 year old male status post repeat incision and drainage left index finger for recurrent infection.  He presented today with increased swelling pain and erythema of the left index finger.  He was seen in the emergency department yesterday where he was restarted on antibiotics.   Recommended repeat incision and drainage of the left index finger including the DIP joint and the middle phalanx where there was evidence of ostium myelitis on radiograph.  Risks, benefits and alternatives of surgery were discussed including the risks of blood loss, infection, damage to nerves, vessels, tendons, ligaments, bone for surgery, need for additional surgery, complications with wound healing, continued pain, nonunion, malunion, stiffness.  He voiced understanding of these risks and elected to proceed.  OPERATIVE COURSE:  After being identified preoperatively by myself,  the patient and I agreed on the procedure and site of the procedure.  The surgical site was marked.  Surgical consent had been signed.  Antibiotics were held for  cultures.  He was transferred to the operating room and placed on the operating table in supine position with the Left upper extremity on an arm board.  General anesthesia was induced by the anesthesiologist.  Left upper extremity was prepped and draped in normal sterile orthopedic fashion.  A surgical pause was performed between the surgeons, anesthesia, and operating room staff and all were in agreement as to the patient, procedure, and site of procedure.  Tourniquet at the proximal aspect of the extremity was inflated to 250 mmHg after exsanguination of the arm with an Esmarch bandage.    There is gross purulence within the wound.  Cultures were taken for aerobes anaerobes.  Wound edges were debrided with pickups.  Gross purulence coursed into the distal aspect of the middle phalanx dorsally at the condyles.  This area was entered and debrided with the curette and rongeurs.  The articular surface was undermined both radially and ulnarly.  There was thin skin at the ulnar side of the digit.  This was able to be entered with the scissors by spreading technique.  There is gross purulence.  The volar wound was also debrided of any devitalized fatty tissue.  There is no gross purulence in the volar wound.  All wounds were debrided using pickups and lightly with rongeurs.  The wounds were then copiously irrigated with sterile saline.  There were packed with quarter inch iodoform gauze.  The finger was dressed with sterile 4 x 4's and wrapped lightly with Coban dressing.  An AlumaFoam splint was placed and wrapped lightly with Coban dressing.  Digital block  was performed with quarter percent plain Marcaine to aid in postoperative analgesia.  The tourniquet was deflated at 21 minutes.  Fingertips were pink with brisk capillary refill after deflation of tourniquet.  The operative  drapes were broken down.  The patient was awoken from anesthesia safely.  He was transferred back to the stretcher and taken to PACU in stable  condition.  He will stay overnight for IV antibiotics.  He can be discharged home in the morning. I will give him a prescription for Tramadol 50 mg 1-2 tabs PO q6 hours prn pain, dispense # 20.  He will also be given a prescription for Bactrim DS 1 p.o. twice daily x10 days.   Tami RibasKUZMA,Jonavin Seder R, MD Electronically signed, 11/13/17

## 2017-11-13 NOTE — Transfer of Care (Signed)
Immediate Anesthesia Transfer of Care Note  Patient: Brent LeavensLavaughn C [name]  Procedure(s) Performed: INCISION AND DRAINAGE LEFT INDEX FINGER (Left Finger)  Patient Location: PACU  Anesthesia Type:General  Level of Consciousness: awake and patient cooperative  Airway & Oxygen Therapy: Patient Spontanous Breathing and Patient connected to face mask oxygen  Post-op Assessment: Report given to RN and Post -op Vital signs reviewed and stable  Post vital signs: Reviewed and stable  Last Vitals:  Vitals Value Taken Time  BP    Temp    Pulse 71 11/13/2017  1:42 PM  Resp    SpO2 100 % 11/13/2017  1:42 PM  Vitals shown include unvalidated device data.  Last Pain:  Vitals:   11/13/17 1213  TempSrc: Oral  PainSc: 4       Patients Stated Pain Goal: 4 (11/13/17 1213)  Complications: No apparent anesthesia complications

## 2017-11-14 ENCOUNTER — Encounter (HOSPITAL_BASED_OUTPATIENT_CLINIC_OR_DEPARTMENT_OTHER): Payer: Self-pay | Admitting: Orthopedic Surgery

## 2017-11-14 DIAGNOSIS — L02512 Cutaneous abscess of left hand: Secondary | ICD-10-CM | POA: Diagnosis not present

## 2017-11-17 LAB — CULTURE, BLOOD (ROUTINE X 2)
Culture: NO GROWTH
Culture: NO GROWTH
Special Requests: ADEQUATE
Special Requests: ADEQUATE

## 2017-11-18 LAB — AEROBIC/ANAEROBIC CULTURE W GRAM STAIN (SURGICAL/DEEP WOUND)

## 2017-11-18 LAB — AEROBIC/ANAEROBIC CULTURE (SURGICAL/DEEP WOUND)

## 2017-11-20 ENCOUNTER — Encounter (HOSPITAL_COMMUNITY): Payer: Self-pay | Admitting: Emergency Medicine

## 2017-11-20 ENCOUNTER — Other Ambulatory Visit: Payer: Self-pay

## 2017-11-20 ENCOUNTER — Emergency Department (HOSPITAL_COMMUNITY): Payer: Medicare Other

## 2017-11-20 ENCOUNTER — Emergency Department (HOSPITAL_COMMUNITY)
Admission: EM | Admit: 2017-11-20 | Discharge: 2017-11-20 | Disposition: A | Discharge status: Home / Self Care | Payer: Medicare Other | Attending: Emergency Medicine | Admitting: Emergency Medicine

## 2017-11-20 DIAGNOSIS — T8131XA Disruption of external operation (surgical) wound, not elsewhere classified, initial encounter: Secondary | ICD-10-CM | POA: Diagnosis not present

## 2017-11-20 DIAGNOSIS — Z79899 Other long term (current) drug therapy: Secondary | ICD-10-CM | POA: Insufficient documentation

## 2017-11-20 DIAGNOSIS — Y69 Unspecified misadventure during surgical and medical care: Secondary | ICD-10-CM | POA: Insufficient documentation

## 2017-11-20 DIAGNOSIS — M79645 Pain in left finger(s): Secondary | ICD-10-CM

## 2017-11-20 DIAGNOSIS — R600 Localized edema: Secondary | ICD-10-CM | POA: Insufficient documentation

## 2017-11-20 DIAGNOSIS — F17210 Nicotine dependence, cigarettes, uncomplicated: Secondary | ICD-10-CM | POA: Diagnosis not present

## 2017-11-20 LAB — CBC WITH DIFFERENTIAL/PLATELET
ABS IMMATURE GRANULOCYTES: 0 10*3/uL (ref 0.0–0.1)
BASOS ABS: 0 10*3/uL (ref 0.0–0.1)
Basophils Relative: 1 %
Eosinophils Absolute: 0.3 10*3/uL (ref 0.0–0.7)
Eosinophils Relative: 5 %
HCT: 40.2 % (ref 39.0–52.0)
HEMOGLOBIN: 13.3 g/dL (ref 13.0–17.0)
Immature Granulocytes: 0 %
LYMPHS PCT: 37 %
Lymphs Abs: 2.6 10*3/uL (ref 0.7–4.0)
MCH: 32.5 pg (ref 26.0–34.0)
MCHC: 33.1 g/dL (ref 30.0–36.0)
MCV: 98.3 fL (ref 78.0–100.0)
Monocytes Absolute: 0.7 10*3/uL (ref 0.1–1.0)
Monocytes Relative: 10 %
NEUTROS ABS: 3.4 10*3/uL (ref 1.7–7.7)
NEUTROS PCT: 47 %
Platelets: 233 10*3/uL (ref 150–400)
RBC: 4.09 MIL/uL — AB (ref 4.22–5.81)
RDW: 13 % (ref 11.5–15.5)
WBC: 7.1 10*3/uL (ref 4.0–10.5)

## 2017-11-20 LAB — COMPREHENSIVE METABOLIC PANEL
ALBUMIN: 3.7 g/dL (ref 3.5–5.0)
ALT: 18 U/L (ref 0–44)
ANION GAP: 10 (ref 5–15)
AST: 21 U/L (ref 15–41)
Alkaline Phosphatase: 61 U/L (ref 38–126)
BUN: 17 mg/dL (ref 6–20)
CO2: 22 mmol/L (ref 22–32)
Calcium: 9.1 mg/dL (ref 8.9–10.3)
Chloride: 105 mmol/L (ref 98–111)
Creatinine, Ser: 1.09 mg/dL (ref 0.61–1.24)
GFR calc Af Amer: >60 mL/min (ref >60)
Glucose, Bld: 160 mg/dL — ABNORMAL HIGH (ref 70–99)
POTASSIUM: 3.8 mmol/L (ref 3.5–5.1)
Sodium: 137 mmol/L (ref 135–145)
Total Bilirubin: 0.3 mg/dL (ref 0.3–1.2)
Total Protein: 6.7 g/dL (ref 6.5–8.1)

## 2017-11-20 LAB — I-STAT CG4 LACTIC ACID, ED: Lactic Acid, Venous: 1.02 mmol/L (ref 0.5–1.9)

## 2017-11-20 MED ORDER — BUPIVACAINE HCL (PF) 0.25 % IJ SOLN
10.0000 mL | Freq: Once | INTRAMUSCULAR | Status: AC
  Administered 2017-11-20: 10 mL
  Filled 2017-11-20: qty 30

## 2017-11-20 NOTE — ED Notes (Signed)
Pt brought back to triage for reassessment after yelling at tech first.  Pt removed bandage in waiting room and telling staff that his finger is falling off.  Spoke with pt about wait and explained triage process.  Pt continuing to be verbally aggressive towards staff.  Attempted multiple times to explain acuities and the order that patients go back.  Encouraged pt to leave bandage in place until he gets to treatment room to see MD.  States again that he didn't go to F/u appt on Friday due to pain.

## 2017-11-20 NOTE — ED Notes (Signed)
Patient given discharge instructions and verbalized understanding.  Patient stable to discharge at this time.  Patient is alert and oriented to baseline.  No distressed noted at this time.  All belongings taken with the patient at discharge.   
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
## 2017-11-20 NOTE — Discharge Instructions (Signed)
Please continue to take your antibiotic, as prescribed. Follow-up with Dr. Merlyn LotKuzma in the office today, tomorrow, or Wednesday.  Call prior to going to the office. Return to the ED should you have fever, worsening pain, swelling into the hand, or any other major concerns.

## 2017-11-20 NOTE — ED Notes (Signed)
Patient transported to X-ray 

## 2017-11-20 NOTE — ED Notes (Addendum)
Pt up to nurse first yelling at staff stating that he came in on an ambulance and does not believe that he should have to wait. Pt assured that his care was started in triage and that we were working to get him back with a provider as soon as possible. Pt verbally aggressive, stating that he is in a lot of pain and that staff is "lolly gagging" and not taking him seriously. Pt assured that we are working hard to get him to see a provider, but that we do have a bit of a wait tonight and there still are people in front of him. Pt verbalized understanding.

## 2017-11-20 NOTE — ED Triage Notes (Addendum)
C/o L index finger infection.  States he had I&D of L index finger on Tuesday (by Dr. Merlyn LotKuzma) due to staph infection and was supposed to return on Friday for packing change.  States he didn't go back because of increased pain and not wanting packing changed.  C/o increased pain and swelling moving towards palm of hand.

## 2017-11-27 ENCOUNTER — Other Ambulatory Visit: Payer: Self-pay | Admitting: Orthopedic Surgery

## 2017-11-28 ENCOUNTER — Other Ambulatory Visit: Payer: Self-pay

## 2017-11-28 ENCOUNTER — Encounter (HOSPITAL_BASED_OUTPATIENT_CLINIC_OR_DEPARTMENT_OTHER): Payer: Self-pay

## 2017-11-28 ENCOUNTER — Encounter (HOSPITAL_BASED_OUTPATIENT_CLINIC_OR_DEPARTMENT_OTHER)
Admission: RE | Disposition: A | Discharge status: Home / Self Care | Payer: Self-pay | Source: Ambulatory Visit | Attending: Orthopedic Surgery

## 2017-11-28 ENCOUNTER — Ambulatory Visit (HOSPITAL_BASED_OUTPATIENT_CLINIC_OR_DEPARTMENT_OTHER): Payer: Medicare Other | Admitting: Anesthesiology

## 2017-11-28 ENCOUNTER — Ambulatory Visit (HOSPITAL_BASED_OUTPATIENT_CLINIC_OR_DEPARTMENT_OTHER)
Admission: RE | Admit: 2017-11-28 | Discharge: 2017-11-28 | Disposition: A | Discharge status: Home / Self Care | Payer: Medicare Other | Source: Ambulatory Visit | Attending: Orthopedic Surgery | Admitting: Orthopedic Surgery

## 2017-11-28 DIAGNOSIS — M868X4 Other osteomyelitis, hand: Secondary | ICD-10-CM | POA: Diagnosis present

## 2017-11-28 DIAGNOSIS — F209 Schizophrenia, unspecified: Secondary | ICD-10-CM | POA: Diagnosis not present

## 2017-11-28 DIAGNOSIS — F319 Bipolar disorder, unspecified: Secondary | ICD-10-CM | POA: Insufficient documentation

## 2017-11-28 DIAGNOSIS — F17210 Nicotine dependence, cigarettes, uncomplicated: Secondary | ICD-10-CM | POA: Insufficient documentation

## 2017-11-28 DIAGNOSIS — Z79899 Other long term (current) drug therapy: Secondary | ICD-10-CM | POA: Insufficient documentation

## 2017-11-28 DIAGNOSIS — F4310 Post-traumatic stress disorder, unspecified: Secondary | ICD-10-CM | POA: Diagnosis not present

## 2017-11-28 HISTORY — PX: AMPUTATION: SHX166

## 2017-11-28 SURGERY — AMPUTATION DIGIT
Anesthesia: Monitor Anesthesia Care | Site: Hand | Laterality: Left

## 2017-11-28 MED ORDER — CHLORHEXIDINE GLUCONATE 4 % EX LIQD: 60.0000 mL | Freq: Once | CUTANEOUS | Status: DC

## 2017-11-28 MED ORDER — OXYCODONE HCL 5 MG PO TABS: 5.0000 mg | ORAL_TABLET | Freq: Once | ORAL | Status: DC | PRN

## 2017-11-28 MED ORDER — VANCOMYCIN HCL 1000 MG IV SOLR
INTRAVENOUS | Status: DC | PRN
  Administered 2017-11-28: 1000 mg via INTRAVENOUS

## 2017-11-28 MED ORDER — FENTANYL CITRATE (PF) 100 MCG/2ML IJ SOLN
INTRAMUSCULAR | Status: AC
  Filled 2017-11-28: qty 2

## 2017-11-28 MED ORDER — MIDAZOLAM HCL 2 MG/2ML IJ SOLN
INTRAMUSCULAR | Status: AC
  Filled 2017-11-28: qty 2

## 2017-11-28 MED ORDER — MIDAZOLAM HCL 5 MG/5ML IJ SOLN
INTRAMUSCULAR | Status: DC | PRN
  Administered 2017-11-28 (×2): 2 mg via INTRAVENOUS

## 2017-11-28 MED ORDER — PROPOFOL 500 MG/50ML IV EMUL
INTRAVENOUS | Status: DC | PRN
  Administered 2017-11-28: 100 ug/kg/min via INTRAVENOUS

## 2017-11-28 MED ORDER — VANCOMYCIN HCL IN DEXTROSE 1-5 GM/200ML-% IV SOLN
INTRAVENOUS | Status: AC
  Filled 2017-11-28: qty 200

## 2017-11-28 MED ORDER — LACTATED RINGERS IV SOLN
INTRAVENOUS | Status: DC
  Administered 2017-11-28 (×2): via INTRAVENOUS

## 2017-11-28 MED ORDER — LIDOCAINE HCL (CARDIAC) PF 100 MG/5ML IV SOSY
PREFILLED_SYRINGE | INTRAVENOUS | Status: DC | PRN
  Administered 2017-11-28: 100 mg via INTRAVENOUS

## 2017-11-28 MED ORDER — EPHEDRINE SULFATE 50 MG/ML IJ SOLN
INTRAMUSCULAR | Status: AC
  Filled 2017-11-28: qty 1

## 2017-11-28 MED ORDER — SODIUM CHLORIDE 0.9 % IJ SOLN
INTRAMUSCULAR | Status: AC
  Filled 2017-11-28: qty 20

## 2017-11-28 MED ORDER — HYDROCODONE-ACETAMINOPHEN 5-325 MG PO TABS: ORAL_TABLET | ORAL | 0 refills | Status: AC

## 2017-11-28 MED ORDER — MIDAZOLAM HCL 2 MG/2ML IJ SOLN: 1.0000 mg | INTRAMUSCULAR | Status: DC | PRN

## 2017-11-28 MED ORDER — BUPIVACAINE HCL (PF) 0.25 % IJ SOLN
INTRAMUSCULAR | Status: DC | PRN
  Administered 2017-11-28: 5 mL

## 2017-11-28 MED ORDER — FENTANYL CITRATE (PF) 100 MCG/2ML IJ SOLN: 25.0000 ug | INTRAMUSCULAR | Status: DC | PRN

## 2017-11-28 MED ORDER — ONDANSETRON HCL 4 MG/2ML IJ SOLN: 4.0000 mg | Freq: Four times a day (QID) | INTRAMUSCULAR | Status: DC | PRN

## 2017-11-28 MED ORDER — SCOPOLAMINE 1 MG/3DAYS TD PT72: 1.0000 | MEDICATED_PATCH | Freq: Once | TRANSDERMAL | Status: DC | PRN

## 2017-11-28 MED ORDER — FENTANYL CITRATE (PF) 100 MCG/2ML IJ SOLN
INTRAMUSCULAR | Status: DC | PRN
  Administered 2017-11-28: 100 ug via INTRAVENOUS

## 2017-11-28 MED ORDER — FENTANYL CITRATE (PF) 100 MCG/2ML IJ SOLN: 50.0000 ug | INTRAMUSCULAR | Status: DC | PRN

## 2017-11-28 MED ORDER — LIDOCAINE HCL (PF) 1 % IJ SOLN
INTRAMUSCULAR | Status: DC | PRN
  Administered 2017-11-28: 5 mL

## 2017-11-28 MED ORDER — CEFAZOLIN SODIUM-DEXTROSE 2-4 GM/100ML-% IV SOLN: 2.0000 g | INTRAVENOUS | Status: DC

## 2017-11-28 MED ORDER — LIDOCAINE HCL (PF) 1 % IJ SOLN
INTRAMUSCULAR | Status: AC
  Filled 2017-11-28: qty 30

## 2017-11-28 MED ORDER — ONDANSETRON HCL 4 MG/2ML IJ SOLN
INTRAMUSCULAR | Status: DC | PRN
  Administered 2017-11-28: 4 mg via INTRAVENOUS

## 2017-11-28 MED ORDER — OXYCODONE HCL 5 MG/5ML PO SOLN: 5.0000 mg | Freq: Once | ORAL | Status: DC | PRN

## 2017-11-28 SURGICAL SUPPLY — 44 items
BANDAGE ACE 3X5.8 VEL STRL LF (GAUZE/BANDAGES/DRESSINGS) IMPLANT
BLADE MINI RND TIP GREEN BEAV (BLADE) IMPLANT
BLADE SURG 15 STRL LF DISP TIS (BLADE) ×2 IMPLANT
BLADE SURG 15 STRL SS (BLADE) ×4
BNDG COHESIVE 1X5 TAN STRL LF (GAUZE/BANDAGES/DRESSINGS) IMPLANT
BNDG ELASTIC 2X5.8 VLCR STR LF (GAUZE/BANDAGES/DRESSINGS) IMPLANT
BNDG ESMARK 4X9 LF (GAUZE/BANDAGES/DRESSINGS) IMPLANT
BNDG GAUZE 1X2.1 STRL (MISCELLANEOUS) IMPLANT
CHLORAPREP W/TINT 26ML (MISCELLANEOUS) IMPLANT
CORD BIPOLAR FORCEPS 12FT (ELECTRODE) ×3 IMPLANT
COVER BACK TABLE 60X90IN (DRAPES) ×3 IMPLANT
COVER MAYO STAND STRL (DRAPES) ×3 IMPLANT
DECANTER SPIKE VIAL GLASS SM (MISCELLANEOUS) IMPLANT
DRAIN PENROSE 1/4X12 LTX STRL (WOUND CARE) IMPLANT
DRAPE EXTREMITY T 121X128X90 (DRAPE) ×3 IMPLANT
DRAPE OEC MINIVIEW 54X84 (DRAPES) IMPLANT
DRAPE SURG 17X23 STRL (DRAPES) ×3 IMPLANT
GAUZE SPONGE 4X4 12PLY STRL (GAUZE/BANDAGES/DRESSINGS) ×3 IMPLANT
GAUZE SPONGE 4X4 12PLY STRL LF (GAUZE/BANDAGES/DRESSINGS) IMPLANT
GAUZE XEROFORM 1X8 LF (GAUZE/BANDAGES/DRESSINGS) ×3 IMPLANT
GLOVE BIO SURGEON STRL SZ7.5 (GLOVE) ×3 IMPLANT
GLOVE BIOGEL PI IND STRL 8 (GLOVE) ×1 IMPLANT
GLOVE BIOGEL PI INDICATOR 8 (GLOVE) ×2
GOWN STRL REUS W/ TWL LRG LVL3 (GOWN DISPOSABLE) ×1 IMPLANT
GOWN STRL REUS W/TWL LRG LVL3 (GOWN DISPOSABLE) ×2
GOWN STRL REUS W/TWL XL LVL3 (GOWN DISPOSABLE) ×3 IMPLANT
NEEDLE HYPO 25X1 1.5 SAFETY (NEEDLE) IMPLANT
NS IRRIG 1000ML POUR BTL (IV SOLUTION) ×3 IMPLANT
PACK BASIN DAY SURGERY FS (CUSTOM PROCEDURE TRAY) ×3 IMPLANT
PADDING CAST ABS 4INX4YD NS (CAST SUPPLIES) ×2
PADDING CAST ABS COTTON 4X4 ST (CAST SUPPLIES) ×1 IMPLANT
STOCKINETTE 4X48 STRL (DRAPES) ×3 IMPLANT
SUT CHROMIC 4 0 P 3 18 (SUTURE) IMPLANT
SUT ETHILON 3 0 PS 1 (SUTURE) IMPLANT
SUT ETHILON 4 0 PS 2 18 (SUTURE) IMPLANT
SUT MERSILENE 4 0 P 3 (SUTURE) IMPLANT
SUT MON AB 5-0 P3 18 (SUTURE) IMPLANT
SUT VIC AB 4-0 P-3 18XBRD (SUTURE) IMPLANT
SUT VIC AB 4-0 P3 18 (SUTURE)
SYR BULB 3OZ (MISCELLANEOUS) ×3 IMPLANT
SYR CONTROL 10ML LL (SYRINGE) IMPLANT
TOWEL GREEN STERILE FF (TOWEL DISPOSABLE) ×3 IMPLANT
TRAY DSU PREP LF (CUSTOM PROCEDURE TRAY) ×3 IMPLANT
UNDERPAD 30X30 (UNDERPADS AND DIAPERS) ×3 IMPLANT

## 2017-11-28 NOTE — H&P (Signed)
[name] is an 57 y.o. male.   Chief Complaint: left index finger osteomyelitis HPI: 57 yo male s/p multiple incision and drainage left index finger for infection.  Osteomyelitis currently with exposed bone in wound.  He wishes to proceed with amputation to achieve healing of the digit.    Allergies: No Known Allergies  Past Medical History:  Diagnosis Date  . Bipolar 1 disorder (HCC)   . Hepatitis C   . Hepatitis C virus   . Post traumatic stress disorder (PTSD)   . PTSD (post-traumatic stress disorder)   . Schizophrenia, schizo-affective (HCC)     Past Surgical History:  Procedure Laterality Date  . APPENDECTOMY    . APPENDECTOMY    . CERVICAL SPINE SURGERY    . GANGLION CYST EXCISION    . I&D EXTREMITY Left 09/21/2017   Procedure: IRRIGATION AND DEBRIDEMENT HAND;  Surgeon: Betha Loa, MD;  Location: [facility] OR;  Service: Orthopedics;  Laterality: Left;  . INCISION AND DRAINAGE Left 10/09/2017   Procedure: INCISION AND DRAINAGE LEFT HAND;  Surgeon: Betha Loa, MD;  Location: [facility];  Service: Orthopedics;  Laterality: Left;  . INCISION AND DRAINAGE Left 11/13/2017   Procedure: INCISION AND DRAINAGE LEFT INDEX FINGER;  Surgeon: Betha Loa, MD;  Location: [facility];  Service: Orthopedics;  Laterality: Left;  . SHOULDER SURGERY      Family History: Family History  Problem Relation Age of Onset  . Hypertension Mother   . Diabetes Father   . Hypertension Brother     Social History:   reports that he has been smoking cigarettes and cigars.  He has never used smokeless tobacco. He reports that he drinks alcohol. He reports that he has current or past drug history. Drugs: Cocaine, Marijuana, and Methamphetamines.  Medications: Medications Prior to Admission  Medication Sig Dispense Refill  . cholecalciferol (VITAMIN D) 1000 units tablet Take 1,000 Units by mouth daily.    Marland Kitchen oxyCODONE-acetaminophen (PERCOCET/ROXICET) 5-325 MG tablet  Take 2 tablets by mouth every 4 (four) hours as needed for severe pain.    . Sulfamethoxazole-Trimethoprim (BACTRIM PO) Take by mouth.    . vitamin E 100 UNIT capsule Take 100 Units by mouth daily.    . ondansetron (ZOFRAN ODT) 4 MG disintegrating tablet Take 1 tablet (4 mg total) by mouth every 8 (eight) hours as needed for nausea or vomiting. (Patient not taking: Reported on 11/20/2017) 20 tablet 0  . traMADol (ULTRAM) 50 MG tablet 1-2 tabs PO q6 hours prn pain (Patient not taking: Reported on 11/20/2017) 20 tablet 0    No results found for this or any previous visit (from the past 48 hour(s)).  No results found.   A comprehensive review of systems was negative.  Blood pressure 124/72, pulse 63, temperature 98.2 F (36.8 C), temperature source Oral, resp. rate 18, height 6\' 1"  (1.854 m), weight 80.7 kg (178 lb), SpO2 97 %.  General appearance: alert, cooperative and appears stated age Head: Normocephalic, without obvious abnormality, atraumatic Neck: supple, symmetrical, trachea midline Cardio: regular rate and rhythm Resp: clear to auscultation bilaterally Extremities: Intact sensation and capillary refill all digits.  +epl/fpl/io.   Pulses: 2+ and symmetric Skin: Skin color, texture, turgor normal. No rashes or lesions Neurologic: Grossly normal Incision/Wound: wounds at distal aspect of left digit with exposed bone  Assessment/Plan Left index finger osteomyelitis and exposed bone.  Non operative and operative treatment options were discussed with the patient and patient wishes to proceed  with operative treatment. Risks, benefits, and alternatives of surgery were discussed and the patient agrees with the plan of care.   Kiarah Eckstein R 11/28/2017, 3:06 PM

## 2017-11-28 NOTE — Discharge Instructions (Addendum)

## 2017-11-28 NOTE — Anesthesia Preprocedure Evaluation (Signed)
Anesthesia Evaluation  Patient identified by MRN, date of birth, ID band Patient awake    Reviewed: Allergy & Precautions, H&P , NPO status , Patient's Chart, lab work & pertinent test results  Airway Mallampati: II   Neck ROM: full    Dental   Pulmonary Current Smoker,    breath sounds clear to auscultation       Cardiovascular negative cardio ROS   Rhythm:regular Rate:Normal     Neuro/Psych PSYCHIATRIC DISORDERS Anxiety Bipolar Disorder Schizophrenia    GI/Hepatic (+) Hepatitis -, C  Endo/Other    Renal/GU      Musculoskeletal  (+) Arthritis ,   Abdominal   Peds  Hematology   Anesthesia Other Findings   Reproductive/Obstetrics                             Anesthesia Physical Anesthesia Plan  ASA: II  Anesthesia Plan: MAC   Post-op Pain Management:    Induction: Intravenous  PONV Risk Score and Plan: 0 and Propofol infusion, Ondansetron and Treatment may vary due to age or medical condition  Airway Management Planned: Simple Face Mask  Additional Equipment:   Intra-op Plan:   Post-operative Plan:   Informed Consent: I have reviewed the patients History and Physical, chart, labs and discussed the procedure including the risks, benefits and alternatives for the proposed anesthesia with the patient or authorized representative who has indicated his/her understanding and acceptance.     Plan Discussed with: CRNA, Anesthesiologist and Surgeon  Anesthesia Plan Comments:         Anesthesia Quick Evaluation

## 2017-11-28 NOTE — Transfer of Care (Signed)
Immediate Anesthesia Transfer of Care Note  Patient: [name]  Procedure(s) Performed: LEFT INDEX AMPUTATION (Left Hand)  Patient Location: PACU  Anesthesia Type:MAC  Level of Consciousness: awake  Airway & Oxygen Therapy: Patient Spontanous Breathing and Patient connected to face mask oxygen  Post-op Assessment: Report given to RN and Post -op Vital signs reviewed and stable  Post vital signs: Reviewed and stable  Last Vitals:  Vitals Value Taken Time  BP    Temp    Pulse 67 11/28/2017  5:39 PM  Resp    SpO2 100 % 11/28/2017  5:39 PM  Vitals shown include unvalidated device data.  Last Pain:  Vitals:   11/28/17 1338  TempSrc: Oral  PainSc: 0-No pain         Complications: No apparent anesthesia complications

## 2017-11-28 NOTE — Op Note (Signed)
NAME: SUHAIB GUZZO MEDICAL RECORD NO: [id number] DATE OF BIRTH: 11/16/1960 FACILITY: [facility] LOCATION:  S[facility] CENTER PHYSICIAN: Tami Ribas, MD   OPERATIVE REPORT   DATE OF PROCEDURE: 11/28/17    PREOPERATIVE DIAGNOSIS:   Left index finger osteomyelitis and nonhealing wound   POSTOPERATIVE DIAGNOSIS:   Left index finger osteomyelitis and nonhealing wound   PROCEDURE:   Amputation left index finger through middle phalanx   SURGEON:  Betha Loa, M.D.   ASSISTANT: none Cindee Salt, MD   ANESTHESIA:  Local with sedation   INTRAVENOUS FLUIDS:  Per anesthesia flow sheet.   ESTIMATED BLOOD LOSS:  Minimal.   COMPLICATIONS:  None.   SPECIMENS:   Left index finger to pathology for gross exam only   TOURNIQUET TIME:    Total Tourniquet Time Documented: Upper Arm (Left) - 20 minutes Total: Upper Arm (Left) - 20 minutes    DISPOSITION:  Stable to PACU.   INDICATIONS: 57 year old male who is undergone multiple incision and drainages of left index finger for infection and osteomyelitis.  He wishes to proceed with amputation to gain healing of the finger. Risks, benefits and alternatives of surgery were discussed including the risks of blood loss, infection, damage to nerves, vessels, tendons, ligaments, bone for surgery, need for additional surgery, complications with wound healing, continued pain, nonunion, malunion, stiffness.  He voiced understanding of these risks and elected to proceed.  OPERATIVE COURSE:  After being identified preoperatively by myself,  the patient and I agreed on the procedure and site of the procedure.  The surgical site was marked.  Surgical consent had been signed.  Antibiotics were held for cultures.He was transferred to the operating room and placed on the operating table in supine position with the Left upper extremity on an arm board.  A surgical pause was performed between surgeons anesthesia operative staff and all were in agreement as  to the patient procedure and site of procedure. Sedation was induced by the anesthesiologist.  A digital block was performed with 10 cc of half-and-half solution 1% plain lidocaine and quarter percent plain Marcaine.  Left upper extremity was prepped and draped in normal sterile orthopedic fashion.  A surgical pause was performed between the surgeons, anesthesia, and operating room staff and all were in agreement as to the patient, procedure, and site of procedure.  Tourniquet at the proximal aspect of the extremity was inflated to 250 mmHg after exsanguination of the arm with an Esmarch bandage.    The wound was explored.  There was some gross purulence.  The FDP tendon was very soft and friable.  It was still attached to the distal phalanx.  The digit was amputated initially through the DIP joint.  The Aundria Rud were used to debride devitalized tissue including skin subtenons tissues and tissue deep to fascia including the tendon.  There were then used to remove the osteomyelitis bone of the distal aspect of the middle phalanx.  This was done until good bone was encountered.  The wound was copiously irrigated with sterile saline.  The skin edges were tacked back together using 5-0 Monocryl suture in an interrupted fashion.  An area was left open both volarly and dorsally for drainage.  A vessel loop drain was placed through this area.  The wound was dressed with sterile Xeroform while leaving area for drainage.  It was then dressed with a 4 x 4 and wrapped lightly with Coban dressing.  An AlumaFoam splint was placed and wrapped lightly with  Coban dressing.  The tourniquet was deflated at 20 minutes.  Fingertips were pink with brisk capillary refill after deflation of tourniquet.  The operative  drapes were broken down.  The patient was awoken from anesthesia safely.  He was transferred back to the stretcher and taken to PACU in stable condition.  I will see him back in the office in 3-4 days for postoperative  followup.  I will give him a prescription for Norco 5/325 1-2 tabs PO q6 hours prn pain, dispense # 20 and Norco 5/325 1-2 tabs PO q6 hours prn pain, dispense # 30 and he will continue on Bactrim DS 1 p.o. twice daily.   Tami RibasKUZMA,Greig Altergott R, MD Electronically signed, 11/28/17

## 2017-11-28 NOTE — Anesthesia Postprocedure Evaluation (Signed)
Anesthesia Post Note  Patient: [name]  Procedure(s) Performed: LEFT INDEX AMPUTATION (Left Hand)     Patient location during evaluation: PACU Anesthesia Type: MAC Level of consciousness: awake and alert Pain management: pain level controlled Vital Signs Assessment: post-procedure vital signs reviewed and stable Respiratory status: spontaneous breathing, nonlabored ventilation, respiratory function stable and patient connected to nasal cannula oxygen Cardiovascular status: stable and blood pressure returned to baseline Postop Assessment: no apparent nausea or vomiting Anesthetic complications: no    Last Vitals:  Vitals:   11/28/17 1739 11/28/17 1745  BP: (!) 110/50 (!) 119/53  Pulse: 67 (!) 59  Resp: 16 13  Temp: 36.4 C   SpO2: 100% 100%    Last Pain:  Vitals:   11/28/17 1739  TempSrc:   PainSc: 0-No pain                 Patrica Mendell S

## 2017-11-28 NOTE — Op Note (Signed)
I assisted Surgeon(s) and Role:    Betha Loa* Kenniel Bergsma, Kevin, MD - Primary on the Procedure(s): LEFT INDEX AMPUTATION on 11/28/2017.  I provided assistance on this case as follows: setup,stabilization amputation and closure. Electronically signed by: Nicki ReaperKUZMA,Charnika Herbst R, MD Date: 11/28/2017 Time: 5:30 PM

## 2017-11-28 NOTE — Anesthesia Procedure Notes (Signed)
Procedure Name: MAC Date/Time: 11/28/2017 5:01 PM Performed by: Marrianne Mood, CRNA Pre-anesthesia Checklist: Patient identified, Timeout performed, Emergency Drugs available, Suction available and Patient being monitored Patient Re-evaluated:Patient Re-evaluated prior to induction Oxygen Delivery Method: Simple face mask Preoxygenation: Pre-oxygenation with 100% oxygen

## 2017-11-29 ENCOUNTER — Encounter (HOSPITAL_BASED_OUTPATIENT_CLINIC_OR_DEPARTMENT_OTHER): Payer: Self-pay | Admitting: Orthopedic Surgery

## 2017-11-29 NOTE — Addendum Note (Signed)
Addendum  created 11/29/17 [id number] by Lance CoonWebster, [place], CRNA   Charge Capture section accepted

## 2017-12-03 LAB — AEROBIC/ANAEROBIC CULTURE (SURGICAL/DEEP WOUND)

## 2017-12-03 LAB — AEROBIC/ANAEROBIC CULTURE W GRAM STAIN (SURGICAL/DEEP WOUND)

## 2018-04-18 ENCOUNTER — Other Ambulatory Visit: Payer: Self-pay

## 2018-04-18 ENCOUNTER — Encounter (HOSPITAL_COMMUNITY): Payer: Self-pay

## 2018-04-18 ENCOUNTER — Emergency Department (HOSPITAL_COMMUNITY)
Admission: EM | Admit: 2018-04-18 | Discharge: 2018-04-18 | Disposition: A | Discharge status: Home / Self Care | Payer: Medicare Other | Attending: Emergency Medicine | Admitting: Emergency Medicine

## 2018-04-18 ENCOUNTER — Emergency Department (HOSPITAL_COMMUNITY): Payer: Medicare Other

## 2018-04-18 DIAGNOSIS — Y929 Unspecified place or not applicable: Secondary | ICD-10-CM | POA: Diagnosis not present

## 2018-04-18 DIAGNOSIS — W010XXA Fall on same level from slipping, tripping and stumbling without subsequent striking against object, initial encounter: Secondary | ICD-10-CM | POA: Insufficient documentation

## 2018-04-18 DIAGNOSIS — T148XXA Other injury of unspecified body region, initial encounter: Secondary | ICD-10-CM

## 2018-04-18 DIAGNOSIS — S76001A Unspecified injury of muscle, fascia and tendon of right hip, initial encounter: Secondary | ICD-10-CM | POA: Insufficient documentation

## 2018-04-18 DIAGNOSIS — Z79899 Other long term (current) drug therapy: Secondary | ICD-10-CM | POA: Insufficient documentation

## 2018-04-18 DIAGNOSIS — F17210 Nicotine dependence, cigarettes, uncomplicated: Secondary | ICD-10-CM | POA: Insufficient documentation

## 2018-04-18 DIAGNOSIS — Y999 Unspecified external cause status: Secondary | ICD-10-CM | POA: Diagnosis not present

## 2018-04-18 DIAGNOSIS — S79911A Unspecified injury of right hip, initial encounter: Secondary | ICD-10-CM | POA: Diagnosis present

## 2018-04-18 DIAGNOSIS — Z89022 Acquired absence of left finger(s): Secondary | ICD-10-CM | POA: Diagnosis not present

## 2018-04-18 DIAGNOSIS — Y9301 Activity, walking, marching and hiking: Secondary | ICD-10-CM | POA: Insufficient documentation

## 2018-04-18 MED ORDER — ACETAMINOPHEN 500 MG PO TABS
1000.0000 mg | ORAL_TABLET | Freq: Once | ORAL | Status: AC
  Administered 2018-04-18: 1000 mg via ORAL
  Filled 2018-04-18: qty 2

## 2018-04-18 NOTE — ED Triage Notes (Signed)
Pt states that he was walking with a lot of belongings when he slipped on some mud, injuring his right hip. No LOC, did not hit head.
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
## 2018-04-18 NOTE — Discharge Instructions (Signed)
You can take Tylenol or Ibuprofen as directed for pain. You can alternate Tylenol and Ibuprofen every 4 hours. If you take Tylenol at 1pm, then you can take Ibuprofen at 5pm. Then you can take Tylenol again at 9pm.   Apply heat to the affected area.  [facility] to establish a primary care doctor if you do not have one.   Return to emergency department for any worsening pain, redness or swelling of the hip, fevers or any other worsening or concerning symptoms.

## 2018-07-01 DEATH — deceased
